# Patient Record
Sex: Female | Born: 1950 | Race: White | Hispanic: No | Smoking: Former smoker
Health system: Southern US, Community
[De-identification: ages and names within clinical notes are randomized; demographics above are authoritative.]

## PROBLEM LIST (undated history)

## (undated) DIAGNOSIS — E785 Hyperlipidemia, unspecified: Secondary | ICD-10-CM

## (undated) DIAGNOSIS — J449 Chronic obstructive pulmonary disease, unspecified: Secondary | ICD-10-CM

## (undated) DIAGNOSIS — E119 Type 2 diabetes mellitus without complications: Secondary | ICD-10-CM

## (undated) DIAGNOSIS — I10 Essential (primary) hypertension: Secondary | ICD-10-CM

## (undated) DIAGNOSIS — L309 Dermatitis, unspecified: Secondary | ICD-10-CM

## (undated) HISTORY — PX: LUNG TRANSPLANT, DOUBLE: SHX704

---

## 2015-06-24 ENCOUNTER — Encounter (HOSPITAL_COMMUNITY): Payer: Self-pay

## 2015-06-24 ENCOUNTER — Encounter (HOSPITAL_COMMUNITY)
Admission: RE | Admit: 2015-06-24 | Discharge: 2015-06-24 | Disposition: A | Discharge status: Home / Self Care | Payer: Medicare (Managed Care) | Source: Ambulatory Visit | Attending: Pulmonary Disease | Admitting: Pulmonary Disease

## 2015-06-24 VITALS — BP 127/47 | HR 78 | Resp 18 | Ht 60.5 in | Wt 91.7 lb

## 2015-06-24 DIAGNOSIS — L309 Dermatitis, unspecified: Secondary | ICD-10-CM | POA: Insufficient documentation

## 2015-06-24 DIAGNOSIS — R0600 Dyspnea, unspecified: Secondary | ICD-10-CM

## 2015-06-24 DIAGNOSIS — Z942 Lung transplant status: Secondary | ICD-10-CM | POA: Insufficient documentation

## 2015-06-24 HISTORY — DX: Dermatitis, unspecified: L30.9

## 2015-06-24 HISTORY — DX: Hyperlipidemia, unspecified: E78.5

## 2015-06-24 HISTORY — DX: Essential (primary) hypertension: I10

## 2015-06-24 HISTORY — DX: Chronic obstructive pulmonary disease, unspecified: J44.9

## 2015-06-24 NOTE — Progress Notes (Signed)
Meagan Dawson 65 y.o. female Pulmonary Rehab Orientation Note 1330-1500 Patient arrived today in Cardiac and Pulmonary Rehab for orientation to Pulmonary Rehab. She was transported from [employer] via wheel chair by her son-in-law. She is status post bilateral lung transplant and no longer needs oxygen. Color good, skin warm and dry. Patient is oriented to time and place. Patient's medical history, psychosocial health, and medications reviewed. Psychosocial assessment reveals pt lives with their daughter and son-in-law temporarily. She has had an extended hospitalization from post transplant complications at the [facility]. She has opted to come to [place] to live with her daughter and attend pulmonary rehab and [facility]. Pt reports her stress level is low not that her health is improveing.  Pt does not exhibit signs of depression.PHQ2/9 score 0/NA. Pt shows good  coping skills with positive outlook . She was offered emotional support and reassurance. Physical assessment reveals heart rate is normal, S1S2 present.  Breath sounds clear to auscultation, no wheezes, rales, or rhonchi. Grip strength equal, strong. Distal pulses palpable. No edema noted. Patient reports she does take medications as prescribed. Patient states she follows a Regular diet. The patient reports no specific efforts to gain or lose weight. She has lost 11 pounds since her transplant but encouraged by her 1 pound weight gain since residing with her daughter. Patient's weight will be monitored closely and she will have an individualized consultation and follow up with a RD. Demonstration and practice of PLB using pulse oximeter. Patient able to return demonstration satisfactorily. Safety and hand hygiene in the exercise area reviewed with patient. Patient voices understanding of the information reviewed. Department expectations discussed with patient and achievable goals were set. The patient shows enthusiasm about  attending the program and we look forward to working with this nice lady. The patient is scheduled for a 6 min walk test on Tuesday 3/7 at 3:45 and to begin exercise on Thursday 3/9 in the 1:30 class.   45 minutes was spent on a variety of activities such as assessment of the patient, obtaining baseline data including height, weight, BMI, and grip strength, verifying medical history, allergies, and current medications, and teaching patient strategies for performing tasks with less respiratory effort with emphasis on pursed lip breathing.

## 2015-07-05 ENCOUNTER — Encounter (HOSPITAL_COMMUNITY)
Admission: RE | Admit: 2015-07-05 | Discharge: 2015-07-05 | Disposition: A | Discharge status: Home / Self Care | Payer: Medicare (Managed Care) | Source: Ambulatory Visit | Attending: Pulmonary Disease | Admitting: Pulmonary Disease

## 2015-07-05 DIAGNOSIS — Z942 Lung transplant status: Secondary | ICD-10-CM | POA: Insufficient documentation

## 2015-07-05 DIAGNOSIS — R0600 Dyspnea, unspecified: Secondary | ICD-10-CM | POA: Insufficient documentation

## 2015-07-05 NOTE — Progress Notes (Signed)
Meagan Dawson completed a Six-Minute Walk Test on 07/05/15 . Meagan Dawson walked 539 feet with one standing break.  The patient's lowest oxygen saturation was 98 %, highest heart rate was 85 bpm , and highest blood pressure was 110/58. The patient was on room air. Patient was hindered by leg fatigue during her walk test.

## 2015-07-07 ENCOUNTER — Encounter (HOSPITAL_COMMUNITY)
Admission: RE | Admit: 2015-07-07 | Discharge: 2015-07-07 | Disposition: A | Discharge status: Home / Self Care | Payer: Medicare (Managed Care) | Source: Ambulatory Visit | Attending: Pulmonary Disease | Admitting: Pulmonary Disease

## 2015-07-07 DIAGNOSIS — R0600 Dyspnea, unspecified: Secondary | ICD-10-CM | POA: Diagnosis not present

## 2015-07-07 NOTE — Progress Notes (Signed)
Daily Session Note  Patient Details  Name: Meagan Dawson MRN: [id number] Date of Birth: December 17, 1950 Referring Provider:  No ref. provider found  Encounter Date: 07/07/2015  Check In:     Session Check In - 07/07/15 1345    Check-In   Location [facility]   Staff Present Rosebud Poles, RN, Luisa Hart, RN, BSN;Ramon Dredge, RN, MHA;Dorna Bloom, MS, ACSM RCEP, Exercise Physiologist;Jessica [place], MA, ACSM RCEP, Exercise Physiologist   Supervising physician immediately available to respond to emergencies [facility] immediately available   Physician(s) Elgergawy   Medication changes reported     No   Fall or balance concerns reported    No   Warm-up and Cool-down Performed as group-led instruction   Resistance Training Performed Yes   VAD Patient? No   Pain Assessment   Currently in Pain? No/denies   Multiple Pain Sites No      Capillary Blood Glucose: No results found for this or any previous visit (from the past 24 hour(s)).      Exercise Prescription Changes - 07/07/15 1500    Exercise Review   Progression No   Response to Exercise   Blood Pressure (Admit) 92/84 mmHg   Blood Pressure (Exercise) 86/60 mmHg  increased to 92/56 with hydration   Blood Pressure (Exit) 114/70 mmHg   Heart Rate (Admit) 84 bpm   Heart Rate (Exercise) 87 bpm   Heart Rate (Exit) 83 bpm   Oxygen Saturation (Admit) 100 %   Oxygen Saturation (Exercise) 96 %   Oxygen Saturation (Exit) 98 %   Rating of Perceived Exertion (Exercise) 17   Perceived Dyspnea (Exercise) 0   Symptoms back/leg pain   Duration Progress to 45 minutes of aerobic exercise without signs/symptoms of physical distress   Intensity THRR unchanged  40-80% HRR   Progression   Progression Continue to progress workloads to maintain intensity without signs/symptoms of physical distress.   Resistance Training   Training Prescription Yes   Weight orange bands   Reps 10-12   Recumbant Bike   Level  1   Minutes 15   NuStep   Level 1   Minutes 15   METs 1.5     Goals Met:  Exercise tolerated well Queuing for purse lip breathing No report of cardiac concerns or symptoms  Goals Unmet:  BP, hypotensive post exercise. Responsive to H2O.  Comments: Service time is from 1330 to 1530, see paper ITP for education.   Dr. Rush Farmer is Medical Director for Pulmonary Rehab at [facility].

## 2015-07-12 ENCOUNTER — Encounter (HOSPITAL_COMMUNITY)
Admission: RE | Admit: 2015-07-12 | Discharge: 2015-07-12 | Disposition: A | Discharge status: Home / Self Care | Payer: Medicare (Managed Care) | Source: Ambulatory Visit | Attending: Pulmonary Disease | Admitting: Pulmonary Disease

## 2015-07-12 DIAGNOSIS — R0600 Dyspnea, unspecified: Secondary | ICD-10-CM | POA: Diagnosis not present

## 2015-07-12 NOTE — Progress Notes (Signed)
Daily Session Note  Patient Details  Name: Meagan Dawson MRN: [id number] Date of Birth: [birth year] Referring Provider:  No ref. provider found  Encounter Date: 07/12/2015  Check In:     Session Check In - 07/12/15 1544    Check-In   Location [facility]   Staff Present Rosebud Poles, RN, BSN;Lisa Ysidro Evert, RN;Olinty Celesta Aver, MS, ACSM CEP, Exercise Physiologist   Supervising physician immediately available to respond to emergencies [facility] immediately available   Physician(s) Dr. Marily Memos   Medication changes reported     No   Fall or balance concerns reported    No   Warm-up and Cool-down Performed as group-led instruction   Resistance Training Performed Yes   VAD Patient? No   Pain Assessment   Currently in Pain? No/denies   Multiple Pain Sites No      Capillary Blood Glucose: No results found for this or any previous visit (from the past 24 hour(s)).      Exercise Prescription Changes - 07/12/15 1500    Exercise Review   Progression Yes   Response to Exercise   Blood Pressure (Admit) 90/40 mmHg   Blood Pressure (Exercise) 110/64 mmHg   Blood Pressure (Exit) 110/62 mmHg   Heart Rate (Admit) 78 bpm   Heart Rate (Exercise) 82 bpm   Heart Rate (Exit) 82 bpm   Oxygen Saturation (Admit) 97 %   Oxygen Saturation (Exercise) 97 %   Oxygen Saturation (Exit) 100 %   Rating of Perceived Exertion (Exercise) 17   Perceived Dyspnea (Exercise) 3   Symptoms back/leg pain   Duration Progress to 45 minutes of aerobic exercise without signs/symptoms of physical distress   Intensity THRR unchanged   Progression   Progression Continue to progress workloads to maintain intensity without signs/symptoms of physical distress.   Resistance Training   Training Prescription Yes   Weight orange bands   Reps 10-12   NuStep   Level 3   Minutes 15   METs 1.3   Arm Ergometer   Level 1   Minutes 15   Arm/Foot Ergometer   Level --   Minutes --   Track   Laps 5    Minutes 15     Goals Met:  Very weak and deconditioned, but tolerated exercises well.  Strength training done.  Goals Unmet:  Not Applicable  Comments: Service time is from 133 to 1530    Dr. Rush Farmer is Medical Director for Pulmonary Rehab at [facility].

## 2015-07-14 ENCOUNTER — Encounter (HOSPITAL_COMMUNITY)
Admission: RE | Admit: 2015-07-14 | Discharge: 2015-07-14 | Disposition: A | Discharge status: Home / Self Care | Payer: Medicare (Managed Care) | Source: Ambulatory Visit | Attending: Pulmonary Disease | Admitting: Pulmonary Disease

## 2015-07-14 DIAGNOSIS — R0600 Dyspnea, unspecified: Secondary | ICD-10-CM | POA: Diagnosis not present

## 2015-07-14 NOTE — Progress Notes (Signed)
Daily Session Note  Patient Details  Name: Meagan Dawson MRN: [id number] Date of Birth: 1950-07-07 Referring Provider:  No ref. provider found  Encounter Date: 07/14/2015  Check In:     Session Check In - 07/14/15 1352    Check-In   [facility]   Staff Present Rosebud Poles, RN, Luisa Hart, RN, BSN;Amber Fair, MS, ACSM RCEP, Exercise Physiologist;Annedrea Rosezella [place], RN, Izora Ribas, M.Ed., RD, LDN, CDE, Clinical Nutritionist II   Supervising physician immediately available to respond to emergencies [facility] immediately available   Physician(s) Dr. Jerilee Hoh   Medication changes reported     No   Fall or balance concerns reported    No   Warm-up and Cool-down Performed as group-led instruction   Resistance Training Performed Yes   VAD Patient? No   Pain Assessment   Currently in Pain? No/denies   Multiple Pain Sites No      Capillary Blood Glucose: No results found for this or any previous visit (from the past 24 hour(s)).      Exercise Prescription Changes - 07/14/15 1500    Exercise Review   Progression No   Response to Exercise   Blood Pressure (Admit) 104/60 mmHg   Blood Pressure (Exercise) 96/60 mmHg   Blood Pressure (Exit) 98/60 mmHg   Heart Rate (Admit) 80 bpm   Heart Rate (Exercise) 92 bpm   Heart Rate (Exit) 77 bpm   Oxygen Saturation (Admit) 100 %   Oxygen Saturation (Exercise) 100 %   Oxygen Saturation (Exit) 98 %   Rating of Perceived Exertion (Exercise) 19   Perceived Dyspnea (Exercise) 1   Duration Progress to 45 minutes of aerobic exercise without signs/symptoms of physical distress   Intensity THRR unchanged   Progression   Progression Continue to progress workloads to maintain intensity without signs/symptoms of physical distress.   Resistance Training   Training Prescription Yes   Weight orange bands   Reps 10-12   Interval Training   Interval Training No   Recumbant Bike   Level 1   Minutes 15   tolerated the recumbent bike better today with frequent rest   Track   Laps 5   Minutes 15     Goals Met:  Proper associated with RPD/PD & O2 Sat Exercise tolerated well Strength training completed today  Goals Unmet:  Not Applicable  Comments: Service time is from 1330 to 1525  See paper chart for education topic patient attended today.  Dr. Rush Farmer is Medical Director for Pulmonary Rehab at [facility].

## 2015-07-19 ENCOUNTER — Encounter (HOSPITAL_COMMUNITY)
Admission: RE | Admit: 2015-07-19 | Discharge: 2015-07-19 | Disposition: A | Discharge status: Home / Self Care | Payer: Medicare (Managed Care) | Source: Ambulatory Visit | Attending: Pulmonary Disease | Admitting: Pulmonary Disease

## 2015-07-19 DIAGNOSIS — R0600 Dyspnea, unspecified: Secondary | ICD-10-CM | POA: Diagnosis not present

## 2015-07-19 NOTE — Progress Notes (Signed)
Meagan Dawson 65 y.o. female Nutrition Note Spoke with pt. Pt is underweight. Per discussion, pt's UBW is around 95 lb. Pt wants to gain wt to a goal of 95-105 lb. Pt eats 3 meals and several snacks daily. Pt is living with her daughter, who does much of the cooking/grocery shopping. Pt's Rate Your Plate results not reviewed with pt due to pt's need for wt gain. Pt states her feeding tube was remove "just a few weeks ago."  Pt educated re: high calorie, high protein diet. Pt is on prednisone. Pt reports taking a calcium plus vitamin D supplement before her surgery and is not currently taking a calcium supplement. Pt c/o diarrhea/loose stools since Nissen procedure. Dietary management of diarrhea discussed. Pt expressed understanding of the information reviewed via feedback method.   No results found for: HGBA1C Nutrition Diagnosis ? Food-and nutrition-related knowledge deficit related to lack of exposure to information as related to diagnosis of pulmonary disease ? Increased energy expenditure related to increased energy requirements pre- and post-lung transplant as evidenced by BMI <20 and h/o wt loss and tube feeding Nutrition Rx/Est. Daily Nutrition Needs for: ? wt gain 1800-2300 Kcal  60-80 gm protein   1500 mg or less sodium Nutrition Intervention ? Pt's individual nutrition plan and goals reviewed with pt. ? Benefits of adopting healthy eating habits discussed when pt's Rate Your Plate reviewed. ? Pt to attend the Nutrition and Lung Disease class ? Handout given for: High Calorie, High Protein diet, recipes, and suggestions for increasing kcal and protein. ? Continual client-centered nutrition education by RD, as part of interdisciplinary care. Goal(s) 1. The pt will consume high-energy, high-nutrient dense beverages when necessary to compensate for decreased oral intake of solid foods. 2. Identify food quantities necessary to achieve wt gain of  -2# per week to a goal wt of 95-105 lb at  graduation from pulmonary rehab. Monitor and Evaluate progress toward nutrition goal with team.   Derek Mound, M.Ed, RD, LDN, CDE 07/19/2015 2:37 PM

## 2015-07-19 NOTE — Progress Notes (Signed)
Daily Session Note  Patient Details  Name: Meagan Dawson MRN: [id number] Date of Birth: May 30, 1950 Referring Provider:  No ref. provider found  Encounter Date: 07/19/2015  Check In:     Session Check In - 07/19/15 1506    Check-In   Location [facility]   Staff Present Rosebud Poles, RN, Deland Pretty, MS, ACSM CEP, Exercise Physiologist   Supervising physician immediately available to respond to emergencies [facility] immediately available   Physician(s) Dr. Marily Memos   Medication changes reported     No   Fall or balance concerns reported    No   Warm-up and Cool-down Performed as group-led instruction   Resistance Training Performed Yes   VAD Patient? No   Pain Assessment   Currently in Pain? No/denies   Multiple Pain Sites No      Capillary Blood Glucose: No results found for this or any previous visit (from the past 24 hour(s)).      Exercise Prescription Changes - 07/19/15 1500    Exercise Review   Progression No   Response to Exercise   Blood Pressure (Admit) 96/60 mmHg   Blood Pressure (Exercise) 94/50 mmHg   Blood Pressure (Exit) 92/60 mmHg   Heart Rate (Admit) 76 bpm   Heart Rate (Exercise) 88 bpm   Heart Rate (Exit) 76 bpm   Oxygen Saturation (Admit) 98 %   Oxygen Saturation (Exercise) 8898 %   Oxygen Saturation (Exit) 100 %   Rating of Perceived Exertion (Exercise) 13   Perceived Dyspnea (Exercise) 1   Duration Progress to 45 minutes of aerobic exercise without signs/symptoms of physical distress   Intensity THRR unchanged   Progression   Progression Continue to progress workloads to maintain intensity without signs/symptoms of physical distress.   Resistance Training   Training Prescription Yes   Weight orange bands   Reps 10-12   Interval Training   Interval Training No   Recumbant Bike   Level 1   Minutes 4  nutrition consult interferred with her exercising for the en   NuStep   Level 3   Minutes 15   METs 1.7   Track   Laps --  nutrition consult during this station     Goals Met:  Improved SOB with ADL's Exercise tolerated well Strength training completed today  Goals Unmet:  Not Applicable  Comments: Service time is from 1330 to 1500    Dr. Rush Farmer is Medical Director for Pulmonary Rehab at [facility].

## 2015-07-21 ENCOUNTER — Encounter (HOSPITAL_COMMUNITY)
Admission: RE | Admit: 2015-07-21 | Discharge: 2015-07-21 | Disposition: A | Discharge status: Home / Self Care | Payer: Medicare (Managed Care) | Source: Ambulatory Visit | Attending: Pulmonary Disease | Admitting: Pulmonary Disease

## 2015-07-21 DIAGNOSIS — R0600 Dyspnea, unspecified: Secondary | ICD-10-CM | POA: Diagnosis not present

## 2015-07-21 NOTE — Progress Notes (Signed)
I have reviewed a Home Exercise Prescription with Meagan Dawson. Meagan Dawson is currently walking on treadmill for 5 minutes at home.  The patient was advised to walk 5 days a week for 5-10 minutes.  Meagan Dawson and I discussed how to progress their exercise prescription.  The patient stated that their goals were to [place] without leg pain, and be able to walk on treadmill for 10 minutes. The patient stated that they understand the exercise prescription.  We reviewed exercise guidelines, target heart rate during exercise, oxygen use, weather, home pulse oximeter, endpoints for exercise, and goals.  Patient is encouraged to come to me with any questions. I will continue to follow up with the patient to assist them with progression and safety.     Meagan Dawson [employer]

## 2015-07-21 NOTE — Progress Notes (Signed)
Daily Session Note  Patient Details  Name: Tylene Kinnan MRN: [id number] Date of Birth: 02/08/1951 Referring Provider:  No ref. provider found  Encounter Date: 07/21/2015  Check In:     Session Check In - 07/21/15 1336    Check-In   Location [facility]   Staff Present Joan Behrens, RN, BSN;Lisa Hughes, RN;Amber Fair, MS, ACSM RCEP, Exercise Physiologist   Supervising physician immediately available to respond to emergencies [facility] immediately available   Physician(s) Dr Elgergawy   Medication changes reported     No   Fall or balance concerns reported    No   Warm-up and Cool-down Performed as group-led instruction   Resistance Training Performed Yes   VAD Patient? No   Pain Assessment   Currently in Pain? No/denies   Multiple Pain Sites No      Capillary Blood Glucose: No results found for this or any previous visit (from the past 24 hour(s)).      Exercise Prescription Changes - 07/21/15 1500    Exercise Review   Progression No   Response to Exercise   Blood Pressure (Admit) 102/58 mmHg   Blood Pressure (Exercise) 90/60 mmHg   Blood Pressure (Exit) 94/61 mmHg   Heart Rate (Admit) 84 bpm   Heart Rate (Exercise) 91 bpm   Heart Rate (Exit) 81 bpm   Oxygen Saturation (Admit) 100 %   Oxygen Saturation (Exercise) 98 %   Oxygen Saturation (Exit) 100 %   Rating of Perceived Exertion (Exercise) 13   Perceived Dyspnea (Exercise) 1   Duration Progress to 45 minutes of aerobic exercise without signs/symptoms of physical distress   Intensity THRR unchanged   Progression   Progression Continue to progress workloads to maintain intensity without signs/symptoms of physical distress.   Resistance Training   Training Prescription Yes   Weight orange bands   Reps 10-12   Interval Training   Interval Training No   NuStep   Level 3   Minutes 15   METs 1.7   Track   Laps 6   Minutes 15     Goals Met:  Improved SOB with ADL's Exercise  tolerated well Strength training completed today  Goals Unmet:  Not Applicable  Comments: Service time is from 1330 to 1500    Dr. Wesam G. Yacoub is Medical Director for Pulmonary Rehab at [facility]. 

## 2015-07-26 ENCOUNTER — Encounter (HOSPITAL_COMMUNITY)
Admission: RE | Admit: 2015-07-26 | Discharge: 2015-07-26 | Disposition: A | Discharge status: Home / Self Care | Payer: Medicare (Managed Care) | Source: Ambulatory Visit | Attending: Pulmonary Disease | Admitting: Pulmonary Disease

## 2015-07-26 DIAGNOSIS — R0600 Dyspnea, unspecified: Secondary | ICD-10-CM | POA: Diagnosis not present

## 2015-07-26 NOTE — Progress Notes (Signed)
Daily Session Note  Patient Details  Name: Meagan Dawson MRN: [id number] Date of Birth: 09-28-1950 Referring Provider:  No ref. provider found  Encounter Date: 07/26/2015  Check In:     Session Check In - 07/26/15 1526    Check-In   Location [facility]   Staff Present Rosebud Poles, RN, BSN;Makaiah Terwilliger Ysidro Evert, RN;Olinty Celesta Aver, MS, ACSM CEP, Exercise Physiologist   Supervising physician immediately available to respond to emergencies [facility] immediately available   Physician(s) Dr. Marily Memos   Medication changes reported     No   Fall or balance concerns reported    No   Warm-up and Cool-down Performed as group-led instruction   Resistance Training Performed Yes   VAD Patient? No   Pain Assessment   Currently in Pain? No/denies   Multiple Pain Sites No      Capillary Blood Glucose: No results found for this or any previous visit (from the past 24 hour(s)).      Exercise Prescription Changes - 07/26/15 1500    Exercise Review   Progression Yes   Response to Exercise   Blood Pressure (Admit) 96/60 mmHg   Blood Pressure (Exercise) 92/50 mmHg   Blood Pressure (Exit) 96/54 mmHg   Heart Rate (Admit) 87 bpm   Heart Rate (Exercise) 97 bpm   Heart Rate (Exit) 73 bpm   Oxygen Saturation (Admit) 100 %   Oxygen Saturation (Exercise) 98 %   Oxygen Saturation (Exit) 99 %   Rating of Perceived Exertion (Exercise) 19   Perceived Dyspnea (Exercise) 1   Duration Progress to 45 minutes of aerobic exercise without signs/symptoms of physical distress   Intensity THRR unchanged   Progression   Progression Continue to progress workloads to maintain intensity without signs/symptoms of physical distress.   Resistance Training   Training Prescription Yes   Weight orange bands   Reps 10-12   Interval Training   Interval Training No   Recumbant Bike   Level 1   Minutes 15   NuStep   Level 4   Minutes 15   METs 1.6   Track   Laps 7   Minutes 15     Goals  Met:  Exercise tolerated well No report of cardiac concerns or symptoms Strength training completed today  Goals Unmet:  Not Applicable  Comments: Service time is from 1330 to 1515    Dr. Rush Farmer is Medical Director for Pulmonary Rehab at [facility].

## 2015-07-28 ENCOUNTER — Encounter (HOSPITAL_COMMUNITY)
Admission: RE | Admit: 2015-07-28 | Discharge: 2015-07-28 | Disposition: A | Discharge status: Home / Self Care | Payer: Medicare (Managed Care) | Source: Ambulatory Visit | Attending: Pulmonary Disease | Admitting: Pulmonary Disease

## 2015-07-28 VITALS — Wt 91.3 lb

## 2015-07-28 DIAGNOSIS — R0600 Dyspnea, unspecified: Secondary | ICD-10-CM | POA: Diagnosis not present

## 2015-07-28 NOTE — Progress Notes (Signed)
Daily Session Note  Patient Details  Name: Meagan Dawson MRN: [id number] Date of Birth: 27-Dec-1950 Referring Provider:  No ref. provider found  Encounter Date: 07/28/2015  Check In:     Session Check In - 07/28/15 1359    Check-In   [facility]   Staff Present Rosebud Poles, RN, BSN;Molly diVincenzo, MS, ACSM RCEP, Exercise Physiologist   Supervising physician immediately available to respond to emergencies [facility] immediately available   Physician(s) Dr. Marily Memos   Medication changes reported     No   Fall or balance concerns reported    No   Warm-up and Cool-down Performed as group-led instruction   Resistance Training Performed Yes   VAD Patient? No   Pain Assessment   Currently in Pain? No/denies   Multiple Pain Sites No      Capillary Blood Glucose: No results found for this or any previous visit (from the past 24 hour(s)).      Exercise Prescription Changes - 07/28/15 1600    Exercise Review   Progression Yes   Response to Exercise   Blood Pressure (Admit) 102/56 mmHg   Blood Pressure (Exercise) 98/52 mmHg   Blood Pressure (Exit) 94/50 mmHg   Heart Rate (Admit) 85 bpm   Heart Rate (Exercise) 85 bpm   Heart Rate (Exit) 82 bpm   Oxygen Saturation (Admit) 98 %   Oxygen Saturation (Exercise) 99 %   Oxygen Saturation (Exit) 98 %   Rating of Perceived Exertion (Exercise) 17   Perceived Dyspnea (Exercise) 0   Duration Progress to 45 minutes of aerobic exercise without signs/symptoms of physical distress   Intensity THRR unchanged   Progression   Progression Continue to progress workloads to maintain intensity without signs/symptoms of physical distress.   Resistance Training   Training Prescription Yes   Weight orange bands   Reps 10-12   Interval Training   Interval Training No   Recumbant Bike   Level 1   Minutes 15   NuStep   Level 4   Minutes 15   METs 1.7     Goals Met:  Proper associated with RPD/PD & O2  Sat Improved SOB with ADL's Exercise tolerated well Queuing for purse lip breathing Strength training completed today  Goals Unmet:  Not Applicable  Comments: Service time is from 1330 to 1530  Attended the pulmonary medications class today.  Dr. Rush Farmer is Medical Director for Pulmonary Rehab at [facility].

## 2015-08-02 ENCOUNTER — Encounter (HOSPITAL_COMMUNITY)
Admission: RE | Admit: 2015-08-02 | Discharge: 2015-08-02 | Disposition: A | Discharge status: Home / Self Care | Payer: Medicare (Managed Care) | Source: Ambulatory Visit | Attending: Pulmonary Disease | Admitting: Pulmonary Disease

## 2015-08-02 VITALS — Wt 88.2 lb

## 2015-08-02 DIAGNOSIS — Z942 Lung transplant status: Secondary | ICD-10-CM | POA: Diagnosis present

## 2015-08-02 DIAGNOSIS — R0600 Dyspnea, unspecified: Secondary | ICD-10-CM | POA: Diagnosis not present

## 2015-08-02 NOTE — Progress Notes (Signed)
Daily Session Note  Patient Details  Name: Meagan Dawson MRN: [id number] Date of Birth: 01-Feb-1951 Referring Provider:  No ref. provider found  Encounter Date: 08/02/2015  Check In:     Session Check In - 08/02/15 1534    Check-In   Location [facility]   Staff Present Rodney Langton, RN;Olinty Celesta Aver, MS, ACSM CEP, Exercise Physiologist;Portia Rollene Rotunda, RN, BSN   Supervising physician immediately available to respond to emergencies [facility] immediately available   Physician(s) Dr. Marily Memos   Medication changes reported     Yes  Pt taken off her BP medication.   Fall or balance concerns reported    No   Warm-up and Cool-down Performed as group-led instruction   Resistance Training Performed Yes   VAD Patient? No   Pain Assessment   Currently in Pain? No/denies   Multiple Pain Sites No      Capillary Blood Glucose: No results found for this or any previous visit (from the past 24 hour(s)).      Exercise Prescription Changes - 08/02/15 1500    Exercise Review   Progression Yes   Response to Exercise   Blood Pressure (Admit) 112/60 mmHg   Blood Pressure (Exercise) 102/58 mmHg   Blood Pressure (Exit) 96/60 mmHg   Heart Rate (Admit) 78 bpm   Heart Rate (Exercise) 86 bpm   Heart Rate (Exit) 75 bpm   Oxygen Saturation (Admit) 97 %   Oxygen Saturation (Exercise) 98 %   Oxygen Saturation (Exit) 100 %   Rating of Perceived Exertion (Exercise) 19   Perceived Dyspnea (Exercise) 1   Duration Progress to 45 minutes of aerobic exercise without signs/symptoms of physical distress   Intensity THRR unchanged   Progression   Progression Continue to progress workloads to maintain intensity without signs/symptoms of physical distress.   Resistance Training   Training Prescription Yes   Weight orange bands   Reps 10-12   Interval Training   Interval Training No   Recumbant Bike   Level 1   Minutes 15   NuStep   Level 4   Minutes 15   METs 1.6   Track   Laps 9   Minutes 15     Goals Met:  Achieving weight loss Exercise tolerated well No report of cardiac concerns or symptoms Strength training completed today  Goals Unmet:  Not Applicable  Comments: Service time is from 1330 to 1515    Dr. Rush Farmer is Medical Director for Pulmonary Rehab at [facility].

## 2015-08-04 ENCOUNTER — Encounter (HOSPITAL_COMMUNITY)
Admission: RE | Admit: 2015-08-04 | Discharge: 2015-08-04 | Disposition: A | Discharge status: Home / Self Care | Payer: Medicare (Managed Care) | Source: Ambulatory Visit | Attending: Pulmonary Disease | Admitting: Pulmonary Disease

## 2015-08-04 ENCOUNTER — Encounter (HOSPITAL_COMMUNITY): Payer: Medicare (Managed Care)

## 2015-08-04 VITALS — Wt 89.3 lb

## 2015-08-04 DIAGNOSIS — Z942 Lung transplant status: Secondary | ICD-10-CM

## 2015-08-04 DIAGNOSIS — R0600 Dyspnea, unspecified: Secondary | ICD-10-CM | POA: Diagnosis not present

## 2015-08-04 NOTE — Progress Notes (Signed)
Daily Session Note  Patient Details  Name: Meagan Dawson MRN: [id number] Date of Birth: [birth year] Referring Provider:  Rush Farmer, MD  Encounter Date: 08/04/2015  Check In:     Session Check In - 08/04/15 1419    Check-In   [facility]   Staff Present Rosebud Poles, RN, BSN;Molly diVincenzo, MS, ACSM RCEP, Exercise Physiologist;Lisa Ysidro Evert, RN   Supervising physician immediately available to respond to emergencies [facility] immediately available   Physician(s) Dr. Loleta Books   Medication changes reported     No   Fall or balance concerns reported    No   Warm-up and Cool-down Performed as group-led instruction   Resistance Training Performed Yes   VAD Patient? No   Pain Assessment   Currently in Pain? No/denies   Multiple Pain Sites No      Capillary Blood Glucose: No results found for this or any previous visit (from the past 24 hour(s)).      Exercise Prescription Changes - 08/04/15 1600    Exercise Review   Progression No   Response to Exercise   Blood Pressure (Admit) 118/64 mmHg   Blood Pressure (Exercise) 116/60 mmHg   Blood Pressure (Exit) 100/58 mmHg   Heart Rate (Admit) 78 bpm   Heart Rate (Exercise) 86 bpm   Heart Rate (Exit) 76 bpm   Oxygen Saturation (Admit) 97 %   Oxygen Saturation (Exercise) 99 %   Oxygen Saturation (Exit) 100 %   Rating of Perceived Exertion (Exercise) 19   Perceived Dyspnea (Exercise) 0   Duration Progress to 45 minutes of aerobic exercise without signs/symptoms of physical distress   Intensity THRR unchanged   Progression   Progression Continue to progress workloads to maintain intensity without signs/symptoms of physical distress.   Resistance Training   Training Prescription Yes   Weight orange bands   Reps 10-12   Interval Training   Interval Training No   Recumbant Bike   Level 1   Minutes 15   NuStep   Level 4   Minutes 15   METs 1.7     Goals Met:  Improved SOB with  ADL's Exercise tolerated well Strength training completed today  Goals Unmet:  Not Applicable  Comments: Service time is from 1330 to 1530    Dr. Rush Farmer is Medical Director for Pulmonary Rehab at [facility].

## 2015-08-09 ENCOUNTER — Encounter (HOSPITAL_COMMUNITY)
Admission: RE | Admit: 2015-08-09 | Discharge: 2015-08-09 | Disposition: A | Discharge status: Home / Self Care | Payer: Medicare (Managed Care) | Source: Ambulatory Visit | Attending: Pulmonary Disease | Admitting: Pulmonary Disease

## 2015-08-09 VITALS — Wt 88.2 lb

## 2015-08-09 DIAGNOSIS — R0600 Dyspnea, unspecified: Secondary | ICD-10-CM | POA: Diagnosis not present

## 2015-08-09 DIAGNOSIS — Z942 Lung transplant status: Secondary | ICD-10-CM

## 2015-08-09 NOTE — Progress Notes (Signed)
Daily Session Note  Patient Details  Name: Meagan Dawson MRN: [id number] Date of Birth: 1950/10/29 Referring Provider:  No ref. provider found  Encounter Date: 08/09/2015  Check In:   Capillary Blood Glucose: No results found for this or any previous visit (from the past 24 hour(s)).      Exercise Prescription Changes - 08/09/15 1530    Exercise Review   Progression No   Response to Exercise   Blood Pressure (Admit) 106/56 mmHg   Blood Pressure (Exercise) 120/56 mmHg   Blood Pressure (Exit) 106/56 mmHg   Heart Rate (Admit) 91 bpm   Heart Rate (Exercise) 88 bpm   Heart Rate (Exit) 80 bpm   Oxygen Saturation (Admit) 100 %   Oxygen Saturation (Exercise) 98 %   Oxygen Saturation (Exit) 99 %   Rating of Perceived Exertion (Exercise) 18   Perceived Dyspnea (Exercise) 1   Duration Progress to 45 minutes of aerobic exercise without signs/symptoms of physical distress   Intensity THRR unchanged   Progression   Progression Continue to progress workloads to maintain intensity without signs/symptoms of physical distress.   Resistance Training   Training Prescription Yes   Weight orange bands   Reps 10-12   Interval Training   Interval Training No   Recumbant Bike   Level 1   Minutes 15   NuStep   Level 4   Minutes 15   METs 2   Track   Laps 9   Minutes 15     Goals Met:  Improved SOB with ADL's Using PLB without cueing & demonstrates good technique Exercise tolerated well Queuing for purse lip breathing No report of cardiac concerns or symptoms Strength training completed today  Goals Unmet:  Not Applicable  Comments: Service time is from 1330 to 1510    Dr. Rush Farmer is Medical Director for Pulmonary Rehab at [facility].

## 2015-08-11 ENCOUNTER — Encounter (HOSPITAL_COMMUNITY)
Admission: RE | Admit: 2015-08-11 | Discharge: 2015-08-11 | Disposition: A | Discharge status: Home / Self Care | Payer: Medicare (Managed Care) | Source: Ambulatory Visit | Attending: Pulmonary Disease | Admitting: Pulmonary Disease

## 2015-08-11 VITALS — Wt 88.8 lb

## 2015-08-11 DIAGNOSIS — Z942 Lung transplant status: Secondary | ICD-10-CM

## 2015-08-11 DIAGNOSIS — R0600 Dyspnea, unspecified: Secondary | ICD-10-CM

## 2015-08-11 NOTE — Progress Notes (Signed)
Daily Session Note  Patient Details  Name: Meagan Dawson MRN: [id number] Date of Birth: 02-25-1951 Referring Provider:    Encounter Date: 08/11/2015  Check In:     Session Check In - 08/11/15 1352    Check-In   Staff Present Su Hilt, MS, ACSM RCEP, Exercise Physiologist;Gerrick Ray Rollene Rotunda, RN, Roque Cash, RN   Supervising physician immediately available to respond to emergencies [facility] immediately available   Physician(s) Dr. Aggie Moats   Medication changes reported     No   Fall or balance concerns reported    No   Warm-up and Cool-down Performed as group-led instruction   Resistance Training Performed Yes   Pain Assessment   Currently in Pain? No/denies   Multiple Pain Sites No      Capillary Blood Glucose: No results found for this or any previous visit (from the past 24 hour(s)).      Exercise Prescription Changes - 08/11/15 1555    Exercise Review   Progression No   Response to Exercise   Blood Pressure (Admit) 116/60 mmHg   Blood Pressure (Exercise) 114/68 mmHg   Blood Pressure (Exit) 112/62 mmHg   Heart Rate (Admit) 74 bpm   Heart Rate (Exercise) 82 bpm   Heart Rate (Exit) 70 bpm   Oxygen Saturation (Admit) 100 %   Oxygen Saturation (Exercise) 100 %   Oxygen Saturation (Exit) 99 %   Rating of Perceived Exertion (Exercise) 13   Perceived Dyspnea (Exercise) 1   Duration Progress to 45 minutes of aerobic exercise without signs/symptoms of physical distress   Intensity THRR unchanged   Progression   Progression Continue to progress workloads to maintain intensity without signs/symptoms of physical distress.   Resistance Training   Training Prescription Yes   Weight orange bands   Reps 10-12   Interval Training   Interval Training No   Recumbant Bike   Level --   Minutes --   NuStep   Level --   Minutes --   METs --   Rower   Level 1   Minutes 15   Track   Laps 10   Minutes 15     Goals Met:  Improved SOB with ADL's Using PLB  without cueing & demonstrates good technique Exercise tolerated well No report of cardiac concerns or symptoms Strength training completed today  Goals Unmet:  Not Applicable  Comments: Service time is from 1330 to 1530, see paper ITP for education documentation.   Dr. Rush Farmer is Medical Director for Pulmonary Rehab at [facility].

## 2015-08-16 ENCOUNTER — Encounter (HOSPITAL_COMMUNITY)
Admission: RE | Admit: 2015-08-16 | Discharge: 2015-08-16 | Disposition: A | Discharge status: Home / Self Care | Payer: Medicare (Managed Care) | Source: Ambulatory Visit | Attending: Pulmonary Disease | Admitting: Pulmonary Disease

## 2015-08-16 VITALS — Wt 91.0 lb

## 2015-08-16 DIAGNOSIS — R0600 Dyspnea, unspecified: Secondary | ICD-10-CM | POA: Diagnosis not present

## 2015-08-16 DIAGNOSIS — Z942 Lung transplant status: Secondary | ICD-10-CM

## 2015-08-16 NOTE — Progress Notes (Signed)
Daily Session Note  Patient Details  Name: Meagan Dawson MRN: [id number] Date of Birth: 09-14-1950 Referring Provider:    Encounter Date: 08/16/2015  Check In:     Session Check In - 08/16/15 1425    Check-In   [facility]   Staff Present Rosebud Poles, RN, BSN;Delmi Fulfer, MS, ACSM RCEP, Exercise Physiologist;Lisa Ysidro Evert, RN   Supervising physician immediately available to respond to emergencies [facility] immediately available   Physician(s) Dr. Marily Memos   Medication changes reported     No   Fall or balance concerns reported    No   Warm-up and Cool-down Performed as group-led instruction   Resistance Training Performed Yes   VAD Patient? No   Pain Assessment   Currently in Pain? No/denies   Multiple Pain Sites No      Capillary Blood Glucose: No results found for this or any previous visit (from the past 24 hour(s)).      Exercise Prescription Changes - 08/16/15 1500    Exercise Review   Progression --   Response to Exercise   Blood Pressure (Admit) 120/60 mmHg   Blood Pressure (Exercise) 104/56 mmHg   Blood Pressure (Exit) 112/62 mmHg   Heart Rate (Admit) 68 bpm   Heart Rate (Exercise) 81 bpm   Heart Rate (Exit) 69 bpm   Oxygen Saturation (Admit) 99 %   Oxygen Saturation (Exercise) 97 %   Oxygen Saturation (Exit) 100 %   Rating of Perceived Exertion (Exercise) 13   Perceived Dyspnea (Exercise) 1   Duration Progress to 45 minutes of aerobic exercise without signs/symptoms of physical distress   Intensity THRR unchanged   Progression   Progression Continue to progress workloads to maintain intensity without signs/symptoms of physical distress.   Resistance Training   Training Prescription Yes   Weight orange bands   Reps 10-12   Interval Training   Interval Training No   NuStep   Level 4   Minutes 15   METs 1.5   Rower   Level 1   Minutes 15   Track   Laps 8   Minutes 15     Goals Met:  Exercise tolerated  well Queuing for purse lip breathing No report of cardiac concerns or symptoms Strength training completed today  Goals Unmet:  Not Applicable  Comments: Service time is from 1:30pm to 3:30pm    Dr. Rush Farmer is Medical Director for Pulmonary Rehab at [facility].

## 2015-08-18 ENCOUNTER — Encounter (HOSPITAL_COMMUNITY)
Admission: RE | Admit: 2015-08-18 | Discharge: 2015-08-18 | Disposition: A | Discharge status: Home / Self Care | Payer: Medicare (Managed Care) | Source: Ambulatory Visit | Attending: Pulmonary Disease | Admitting: Pulmonary Disease

## 2015-08-18 VITALS — Wt 89.3 lb

## 2015-08-18 DIAGNOSIS — R0600 Dyspnea, unspecified: Secondary | ICD-10-CM | POA: Diagnosis not present

## 2015-08-18 NOTE — Progress Notes (Signed)
Daily Session Note  Patient Details  Name: Meagan Dawson MRN: [id number] Date of Birth: Feb 16, 1951 Referring Provider:    Encounter Date: 08/18/2015  Check In:     Session Check In - 08/18/15 1349    Check-In   [facility]   Staff Present Rosebud Poles, RN, BSN;Molly diVincenzo, MS, ACSM RCEP, Exercise Physiologist;Tynan Boesel Rollene Rotunda, RN, BSN   Supervising physician immediately available to respond to emergencies [facility] immediately available   Physician(s) Dr. Marily Memos   Medication changes reported     No   Fall or balance concerns reported    No   Warm-up and Cool-down Performed as group-led instruction   Resistance Training Performed Yes   VAD Patient? No   Pain Assessment   Currently in Pain? No/denies   Multiple Pain Sites No      Capillary Blood Glucose: No results found for this or any previous visit (from the past 24 hour(s)).      Exercise Prescription Changes - 08/18/15 1623    Response to Exercise   Blood Pressure (Admit) 120/64 mmHg   Blood Pressure (Exercise) 106/70 mmHg   Blood Pressure (Exit) 122/70 mmHg   Heart Rate (Admit) 79 bpm   Heart Rate (Exercise) 78 bpm   Heart Rate (Exit) 70 bpm   Oxygen Saturation (Admit) 100 %   Oxygen Saturation (Exercise) 100 %   Oxygen Saturation (Exit) 100 %   Rating of Perceived Exertion (Exercise) 12   Perceived Dyspnea (Exercise) 0   Duration Progress to 45 minutes of aerobic exercise without signs/symptoms of physical distress   Intensity THRR unchanged   Progression   Progression Continue to progress workloads to maintain intensity without signs/symptoms of physical distress.   Resistance Training   Training Prescription Yes   Weight orange bands   Reps 10-12   Interval Training   Interval Training No   NuStep   Level 4   Minutes 15   METs 2.3   Rower   Level --   Minutes --   Track   Laps 8   Minutes 15     Goals Met:  Improved SOB with ADL's Using PLB without cueing &  demonstrates good technique Exercise tolerated well No report of cardiac concerns or symptoms Strength training completed today  Goals Unmet:  Not Applicable  Comments: Service time is from 1330 to 1515   Dr. Rush Farmer is Medical Director for Pulmonary Rehab at [facility].

## 2015-08-23 ENCOUNTER — Encounter (HOSPITAL_COMMUNITY)
Admission: RE | Admit: 2015-08-23 | Discharge: 2015-08-23 | Disposition: A | Discharge status: Home / Self Care | Payer: Medicare (Managed Care) | Source: Ambulatory Visit | Attending: Pulmonary Disease | Admitting: Pulmonary Disease

## 2015-08-23 VITALS — Wt 88.8 lb

## 2015-08-23 DIAGNOSIS — Z942 Lung transplant status: Secondary | ICD-10-CM

## 2015-08-23 DIAGNOSIS — R0600 Dyspnea, unspecified: Secondary | ICD-10-CM | POA: Diagnosis not present

## 2015-08-23 NOTE — Progress Notes (Signed)
Daily Session Note  Patient Details  Name: Meagan Dawson MRN: [id number] Date of Birth: Nov 25, 1950 Referring Provider:    Encounter Date: 08/23/2015  Check In:     Session Check In - 08/23/15 1527    Check-In   [facility]   Staff Present Rosebud Poles, RN, BSN;Molly diVincenzo, MS, ACSM RCEP, Exercise Physiologist;Portia Rollene Rotunda, RN, BSN   Supervising physician immediately available to respond to emergencies [facility] immediately available   Physician(s) Dr. Marily Memos   Medication changes reported     No   Fall or balance concerns reported    No   Warm-up and Cool-down Performed as group-led instruction   Resistance Training Performed Yes   VAD Patient? No   Pain Assessment   Currently in Pain? No/denies   Multiple Pain Sites No      Capillary Blood Glucose: No results found for this or any previous visit (from the past 24 hour(s)).      Exercise Prescription Changes - 08/23/15 1500    Response to Exercise   Blood Pressure (Admit) 110/60 mmHg   Blood Pressure (Exercise) 122/60 mmHg   Blood Pressure (Exit) 110/56 mmHg   Heart Rate (Admit) 75 bpm   Heart Rate (Exercise) 82 bpm   Heart Rate (Exit) 76 bpm   Oxygen Saturation (Admit) 99 %   Oxygen Saturation (Exercise) 97 %   Oxygen Saturation (Exit) 99 %   Rating of Perceived Exertion (Exercise) 12   Perceived Dyspnea (Exercise) 0   Duration Progress to 45 minutes of aerobic exercise without signs/symptoms of physical distress   Intensity THRR unchanged   Progression   Progression Continue to progress workloads to maintain intensity without signs/symptoms of physical distress.   Resistance Training   Training Prescription Yes   Weight orange bands   Reps 10-12   Interval Training   Interval Training No   Recumbant Bike   Level 1   Minutes 15   NuStep   Level 4   Minutes 15   METs 2.1   Track   Laps 8   Minutes 15     Goals Met:  Exercise tolerated well No report of cardiac  concerns or symptoms Strength training completed today  Goals Unmet:  Not Applicable  Comments: Service time is from 1330 to 1455    Dr. Rush Farmer is Medical Director for Pulmonary Rehab at [facility].

## 2015-08-25 ENCOUNTER — Encounter (HOSPITAL_COMMUNITY)
Admission: RE | Admit: 2015-08-25 | Discharge: 2015-08-25 | Disposition: A | Discharge status: Home / Self Care | Payer: Medicare (Managed Care) | Source: Ambulatory Visit | Attending: Pulmonary Disease | Admitting: Pulmonary Disease

## 2015-08-25 VITALS — Wt 88.4 lb

## 2015-08-25 DIAGNOSIS — Z942 Lung transplant status: Secondary | ICD-10-CM

## 2015-08-25 DIAGNOSIS — R0600 Dyspnea, unspecified: Secondary | ICD-10-CM

## 2015-08-25 NOTE — Progress Notes (Signed)
Daily Session Note  Patient Details  Name: Meagan Dawson MRN: [id number] Date of Birth: 1951/01/27 Referring Provider:    Encounter Date: 08/25/2015  Check In:     Session Check In - 08/25/15 1330    Check-In   [facility]   Staff Present Rosebud Poles, RN, BSN;Molly diVincenzo, MS, ACSM RCEP, Exercise Physiologist;Carmeline Kowal Ysidro Evert, RN   Supervising physician immediately available to respond to emergencies [facility] immediately available   Medication changes reported     No   Fall or balance concerns reported    No   Warm-up and Cool-down Performed as group-led instruction   Resistance Training Performed Yes   VAD Patient? No   Pain Assessment   Currently in Pain? No/denies   Multiple Pain Sites No      Capillary Blood Glucose: No results found for this or any previous visit (from the past 24 hour(s)).      Exercise Prescription Changes - 08/25/15 1500    Exercise Review   Progression Yes   Response to Exercise   Blood Pressure (Admit) 106/70 mmHg   Blood Pressure (Exercise) 126/60 mmHg   Blood Pressure (Exit) 100/50 mmHg   Heart Rate (Admit) 75 bpm   Heart Rate (Exercise) 79 bpm   Heart Rate (Exit) 72 bpm   Oxygen Saturation (Admit) 99 %   Oxygen Saturation (Exercise) 96 %   Oxygen Saturation (Exit) 99 %   Rating of Perceived Exertion (Exercise) 15   Perceived Dyspnea (Exercise) 1   Duration Progress to 45 minutes of aerobic exercise without signs/symptoms of physical distress   Intensity THRR unchanged   Progression   Progression Continue to progress workloads to maintain intensity without signs/symptoms of physical distress.   Resistance Training   Training Prescription Yes   Weight orange abnds   Reps 10-12   Interval Training   Interval Training No   NuStep   Level 5   Minutes 15   Rower   Level 1   Minutes 15   Track   Laps 7   Minutes 15     Goals Met:  Using PLB without cueing & demonstrates good  technique Exercise tolerated well No report of cardiac concerns or symptoms Strength training completed today  Goals Unmet:  Not Applicable  Comments: Service time is from 1330 to 1445   Dr. Rush Farmer is Medical Director for Pulmonary Rehab at [facility].

## 2015-08-30 ENCOUNTER — Encounter (HOSPITAL_COMMUNITY): Payer: Medicare (Managed Care)

## 2015-09-01 ENCOUNTER — Encounter (HOSPITAL_COMMUNITY): Payer: Medicare (Managed Care)

## 2015-09-06 ENCOUNTER — Encounter (HOSPITAL_COMMUNITY): Payer: Medicare (Managed Care)

## 2015-09-08 ENCOUNTER — Encounter (HOSPITAL_COMMUNITY)
Admission: RE | Admit: 2015-09-08 | Discharge: 2015-09-08 | Disposition: A | Discharge status: Home / Self Care | Payer: Medicare (Managed Care) | Source: Ambulatory Visit | Attending: Pulmonary Disease | Admitting: Pulmonary Disease

## 2015-09-08 VITALS — Wt 91.9 lb

## 2015-09-08 DIAGNOSIS — Z942 Lung transplant status: Secondary | ICD-10-CM | POA: Diagnosis present

## 2015-09-08 DIAGNOSIS — R0600 Dyspnea, unspecified: Secondary | ICD-10-CM | POA: Diagnosis not present

## 2015-09-08 NOTE — Progress Notes (Signed)
Daily Session Note  Patient Details  Name: Meagan Dawson MRN: [id number] Date of Birth: 1951/04/01 Referring Provider:    Encounter Date: 09/08/2015  Check In:     Session Check In - 09/08/15 1345    Check-In   [facility]   Staff Present Rosebud Poles, RN, Luisa Hart, RN, BSN;Molly diVincenzo, MS, ACSM RCEP, Exercise Physiologist   Supervising physician immediately available to respond to emergencies [facility] immediately available   Physician(s) Dr. Marily Memos   Medication changes reported     No   Fall or balance concerns reported    No   Warm-up and Cool-down Performed as group-led instruction   Resistance Training Performed Yes   VAD Patient? No   Pain Assessment   Currently in Pain? No/denies   Multiple Pain Sites No      Capillary Blood Glucose: No results found for this or any previous visit (from the past 24 hour(s)).      Exercise Prescription Changes - 09/08/15 1620    Response to Exercise   Blood Pressure (Admit) 124/74 mmHg   Blood Pressure (Exercise) 110/58 mmHg   Blood Pressure (Exit) 122/60 mmHg   Heart Rate (Admit) 87 bpm   Heart Rate (Exercise) 85 bpm   Heart Rate (Exit) 74 bpm   Oxygen Saturation (Admit) 94 %   Oxygen Saturation (Exercise) 98 %   Oxygen Saturation (Exit) 99 %   Rating of Perceived Exertion (Exercise) 13   Perceived Dyspnea (Exercise) 0   Duration Progress to 45 minutes of aerobic exercise without signs/symptoms of physical distress   Intensity THRR unchanged   Progression   Progression Continue to progress workloads to maintain intensity without signs/symptoms of physical distress.   Resistance Training   Training Prescription Yes   Weight orange abnds   Reps 10-12   Interval Training   Interval Training No   NuStep   Level 5   Minutes 30   METs 1.5   Home Exercise Plan   Plans to continue exercise at Home  plans to buy treadmill to exercise at home post discharge     Goals Met:   Improved SOB with ADL's Using PLB without cueing & demonstrates good technique Exercise tolerated well No report of cardiac concerns or symptoms Strength training completed today  Goals Unmet:  Not Applicable  Comments: Service time is from 1330 to 1545    Dr. Rush Farmer is Medical Director for Pulmonary Rehab at [facility].

## 2015-09-13 ENCOUNTER — Encounter (HOSPITAL_COMMUNITY)
Admission: RE | Admit: 2015-09-13 | Discharge: 2015-09-13 | Disposition: A | Discharge status: Home / Self Care | Payer: Medicare (Managed Care) | Source: Ambulatory Visit | Attending: Pulmonary Disease | Admitting: Pulmonary Disease

## 2015-09-13 NOTE — Progress Notes (Addendum)
Pulmonary Rehab Discharge Note  Meagan Dawson has been discharged from pulmonary rehab.  She attended 16 exercise sessions and greatly improved her strength, stamina and quality of life.  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  She definitely met her program goals which were to gain her strength back so she could return to [place] to live independently.  She was unable to gain weight which was a goal she did not meet.  She states has always been small , she has maintained her weight. She left the program early because she felt she was ready to return to [place].  She was a joy to have in the program.

## 2015-09-15 ENCOUNTER — Encounter (HOSPITAL_COMMUNITY): Payer: Medicare (Managed Care)

## 2015-09-20 ENCOUNTER — Encounter (HOSPITAL_COMMUNITY): Payer: Medicare (Managed Care)

## 2015-09-22 ENCOUNTER — Encounter (HOSPITAL_COMMUNITY): Payer: Medicare (Managed Care)

## 2015-09-27 ENCOUNTER — Encounter (HOSPITAL_COMMUNITY): Payer: Medicare (Managed Care)

## 2015-09-29 ENCOUNTER — Encounter (HOSPITAL_COMMUNITY): Payer: Self-pay

## 2015-10-04 ENCOUNTER — Encounter (HOSPITAL_COMMUNITY): Payer: Self-pay

## 2015-10-06 ENCOUNTER — Encounter (HOSPITAL_COMMUNITY): Payer: Self-pay

## 2015-10-11 ENCOUNTER — Encounter (HOSPITAL_COMMUNITY): Payer: Self-pay

## 2015-10-13 ENCOUNTER — Encounter (HOSPITAL_COMMUNITY): Payer: Self-pay

## 2015-10-18 ENCOUNTER — Encounter (HOSPITAL_COMMUNITY): Payer: Self-pay

## 2015-10-20 ENCOUNTER — Encounter (HOSPITAL_COMMUNITY): Payer: Self-pay

## 2015-10-25 ENCOUNTER — Encounter (HOSPITAL_COMMUNITY): Payer: Self-pay

## 2015-10-27 ENCOUNTER — Encounter (HOSPITAL_COMMUNITY): Payer: Self-pay

## 2015-11-01 ENCOUNTER — Encounter (HOSPITAL_COMMUNITY): Payer: Self-pay

## 2015-11-03 ENCOUNTER — Encounter (HOSPITAL_COMMUNITY): Payer: Self-pay

## 2015-11-08 ENCOUNTER — Encounter (HOSPITAL_COMMUNITY): Payer: Self-pay

## 2016-06-15 ENCOUNTER — Encounter (HOSPITAL_BASED_OUTPATIENT_CLINIC_OR_DEPARTMENT_OTHER): Payer: Self-pay | Admitting: Emergency Medicine

## 2016-06-15 ENCOUNTER — Emergency Department (HOSPITAL_BASED_OUTPATIENT_CLINIC_OR_DEPARTMENT_OTHER)
Admission: EM | Admit: 2016-06-15 | Discharge: 2016-06-15 | Disposition: A | Discharge status: Home / Self Care | Payer: Medicare (Managed Care) | Attending: Emergency Medicine | Admitting: Emergency Medicine

## 2016-06-15 DIAGNOSIS — E119 Type 2 diabetes mellitus without complications: Secondary | ICD-10-CM | POA: Diagnosis not present

## 2016-06-15 DIAGNOSIS — J449 Chronic obstructive pulmonary disease, unspecified: Secondary | ICD-10-CM | POA: Diagnosis not present

## 2016-06-15 DIAGNOSIS — I10 Essential (primary) hypertension: Secondary | ICD-10-CM | POA: Diagnosis not present

## 2016-06-15 DIAGNOSIS — Z87891 Personal history of nicotine dependence: Secondary | ICD-10-CM | POA: Diagnosis not present

## 2016-06-15 DIAGNOSIS — R531 Weakness: Secondary | ICD-10-CM | POA: Diagnosis present

## 2016-06-15 DIAGNOSIS — R21 Rash and other nonspecific skin eruption: Secondary | ICD-10-CM | POA: Diagnosis not present

## 2016-06-15 DIAGNOSIS — Z79899 Other long term (current) drug therapy: Secondary | ICD-10-CM | POA: Insufficient documentation

## 2016-06-15 HISTORY — DX: Type 2 diabetes mellitus without complications: E11.9

## 2016-06-15 LAB — URINALYSIS, MICROSCOPIC (REFLEX): WBC, UA: NONE SEEN WBC/hpf (ref 0–5)

## 2016-06-15 LAB — HEPATIC FUNCTION PANEL
ALT: 13 U/L — ABNORMAL LOW (ref 14–54)
AST: 24 U/L (ref 15–41)
Albumin: 3.8 g/dL (ref 3.5–5.0)
Alkaline Phosphatase: 287 U/L — ABNORMAL HIGH (ref 38–126)
BILIRUBIN DIRECT: 0.1 mg/dL (ref 0.1–0.5)
BILIRUBIN TOTAL: 0.5 mg/dL (ref 0.3–1.2)
Indirect Bilirubin: 0.4 mg/dL (ref 0.3–0.9)
Total Protein: 6.7 g/dL (ref 6.5–8.1)

## 2016-06-15 LAB — BASIC METABOLIC PANEL
Anion gap: 7 (ref 5–15)
BUN: 40 mg/dL — AB (ref 6–20)
CALCIUM: 8.6 mg/dL — AB (ref 8.9–10.3)
CHLORIDE: 103 mmol/L (ref 101–111)
CO2: 25 mmol/L (ref 22–32)
CREATININE: 1.86 mg/dL — AB (ref 0.44–1.00)
GFR calc Af Amer: 31 mL/min — ABNORMAL LOW (ref >60)
GFR calc non Af Amer: 27 mL/min — ABNORMAL LOW (ref >60)
Glucose, Bld: 164 mg/dL — ABNORMAL HIGH (ref 65–99)
Potassium: 4.9 mmol/L (ref 3.5–5.1)
SODIUM: 135 mmol/L (ref 135–145)

## 2016-06-15 LAB — URINALYSIS, ROUTINE W REFLEX MICROSCOPIC
GLUCOSE, UA: NEGATIVE mg/dL
KETONES UR: NEGATIVE mg/dL
Leukocytes, UA: NEGATIVE
Nitrite: NEGATIVE
PROTEIN: 100 mg/dL — AB
Specific Gravity, Urine: 1.019 (ref 1.005–1.030)
pH: 5.5 (ref 5.0–8.0)

## 2016-06-15 LAB — CBC
HCT: 37.7 % (ref 36.0–46.0)
Hemoglobin: 12.4 g/dL (ref 12.0–15.0)
MCH: 30.5 pg (ref 26.0–34.0)
MCHC: 32.9 g/dL (ref 30.0–36.0)
MCV: 92.6 fL (ref 78.0–100.0)
PLATELETS: 258 10*3/uL (ref 150–400)
RBC: 4.07 MIL/uL (ref 3.87–5.11)
RDW: 13.3 % (ref 11.5–15.5)
WBC: 6.5 10*3/uL (ref 4.0–10.5)

## 2016-06-15 LAB — MAGNESIUM: MAGNESIUM: 2.4 mg/dL (ref 1.7–2.4)

## 2016-06-15 LAB — CBG MONITORING, ED: GLUCOSE-CAPILLARY: 157 mg/dL — AB (ref 65–99)

## 2016-06-15 MED ORDER — VALACYCLOVIR HCL 500 MG PO TABS: 1000.0000 mg | ORAL_TABLET | Freq: Every day | ORAL | 0 refills | Status: DC

## 2016-06-15 MED ORDER — HYDROCODONE-ACETAMINOPHEN 5-325 MG PO TABS: 1.0000 | ORAL_TABLET | Freq: Four times a day (QID) | ORAL | 0 refills | Status: AC | PRN

## 2016-06-15 MED ORDER — SODIUM CHLORIDE 0.9 % IV BOLUS (SEPSIS)
1000.0000 mL | Freq: Once | INTRAVENOUS | Status: AC
Start: 2016-06-15 — End: 2016-06-15
  Administered 2016-06-15: 1000 mL via INTRAVENOUS

## 2016-06-15 MED FILL — HYDROCODON-APAP 5-325: 5-325 | 4 days supply | Qty: 15 | Fill #0

## 2016-06-15 MED FILL — VALACYCLOVIR HCL 500 MG TAB: 500 | 7 days supply | Qty: 14 | Fill #0

## 2016-06-15 NOTE — ED Triage Notes (Addendum)
Pt reports decreased appetite, lethargy, weakness since Monday.  Denies fevers, cough, chest pain, shortness of breath.  Patient has rash to LLQ which she noticed yesterday.  Reports intermittent pain to this area.  Patient ambulatory to triage in NAD, answering all questions appropriately.

## 2016-06-15 NOTE — Discharge Instructions (Signed)
Make sure that you drink at least six 8 ounce glasses of water or sugar free Gatorade each day in order to stay well-hydrated. . Take Tylenol for mild pain or the pain medication prescribed for bad pain. Don't take Tylenol together with the pain medication prescribed as the combination can be dangerous to your liver. Your blood sugar was mildly elevated today at 164. Blood creatinine which is a reflection of how well your kidneys are functioning is 1.86, which is similar to values you've had in the past. Return if concern for any reason or call your physicians in [place]

## 2016-06-15 NOTE — ED Notes (Signed)
Patient was provided a labeled urine collection cup but advised that she could not give a specimen right now.

## 2016-06-15 NOTE — ED Notes (Signed)
CBG 157 

## 2016-06-15 NOTE — ED Provider Notes (Addendum)
[place] DEPT [facility] Provider Note   CSN: [id number] Arrival date & time: 06/15/16  1154     History   Chief Complaint Chief Complaint  Patient presents with  . Weakness    HPI Meagan Dawson is a 66 y.o. female.  HPI Complains of generalized weakness and decreased appetite for the past 5 days. She complains of mild left flank pain and a rash on left flank and left lower quadrant onset yesterday. She denies fever denies pain other than slight pain at site of rash which she feels might be shingles. No treatment prior to coming here. No cough no shortness of breath no nausea or vomiting no urinary symptoms. No treatment prior to coming here nothing makes symptoms better or worse Past Medical History:  Diagnosis Date  . COPD (chronic obstructive pulmonary disease) ([place])   . Diabetes mellitus without complication ([place])   . Eczema   . Hyperlipidemia   . Hypertension     There are no active problems to display for this patient.   Past Surgical History:  Procedure Laterality Date  . LUNG TRANSPLANT, DOUBLE      OB History    No data available       Home Medications    Prior to Admission medications   Medication Sig Start Date End Date Taking? Authorizing Provider  acetaminophen (TYLENOL) 325 MG tablet Take 650 mg by mouth every 6 (six) hours as needed.    Historical Provider, MD  amLODipine (NORVASC) 10 MG tablet Take 10 mg by mouth daily. Reported on 08/02/2015    Historical Provider, MD  bacitracin 500 UNIT/GM ointment Apply 1 application topically 2 (two) times daily.    Historical Provider, MD  lansoprazole (PREVACID SOLUTAB) 30 MG disintegrating tablet Take 30 mg by mouth daily.    Historical Provider, MD  loperamide (IMODIUM A-D) 2 MG tablet Take 2 mg by mouth 4 (four) times daily as needed for diarrhea or loose stools.    Historical Provider, MD  magnesium oxide (MAG-OX) 400 MG tablet Take 400 mg by mouth 3 (three) times daily.    Historical Provider, MD    Melatonin 3 MG CAPS Take 3 mg by mouth at bedtime as needed.    Historical Provider, MD  metoprolol tartrate (LOPRESSOR) 25 MG tablet Take 12.5 mg by mouth 2 (two) times daily.    Historical Provider, MD  mycophenolate (CELLCEPT) 500 MG tablet Take 500 mg by mouth 2 (two) times daily.    Historical Provider, MD  predniSONE (DELTASONE) 5 MG tablet Take 5 mg by mouth daily with breakfast. 2 tablets by mouth daily    Historical Provider, MD  sodium bicarbonate 650 MG tablet Take 650 mg by mouth 3 (three) times daily.    Historical Provider, MD  sulfamethoxazole-trimethoprim (BACTRIM,SEPTRA) 400-80 MG tablet Take 1 tablet by mouth 2 (two) times a week. On Monday and Thursday    Historical Provider, MD  tacrolimus (PROGRAF) 1 MG capsule Take 7 mg by mouth every 12 (twelve) hours.    Historical Provider, MD  valGANciclovir (VALCYTE) 450 MG tablet Take 450 mg by mouth daily.    Historical Provider, MD    Family History History reviewed. No pertinent family history.  Social History Social History  Substance Use Topics  . Smoking status: Former Smoker    Packs/day: 1.50    Types: Cigarettes    Start date: 04/30/1962    Quit date: 12/30/1999  . Smokeless tobacco: Never Used  . Alcohol use No  Allergies   Patient has no known allergies.   Review of Systems Review of Systems  Constitutional: Positive for appetite change.  HENT: Negative.   Respiratory: Negative.   Cardiovascular: Negative.   Gastrointestinal: Negative.   Musculoskeletal: Negative.   Skin: Positive for rash.  Allergic/Immunologic: Positive for immunocompromised state.  Neurological: Positive for weakness.  Psychiatric/Behavioral: Negative.   All other systems reviewed and are negative.    Physical Exam Updated Vital Signs BP 132/75 (BP Location: Right Arm)   Pulse 79   Temp 98 F (36.7 C) (Oral)   Resp 20   Ht 5\' 1"  (1.549 m)   Wt 81 lb (36.7 kg)   SpO2 97%   BMI 15.30 kg/m   Physical Exam   Constitutional: She appears well-developed and well-nourished. No distress.  HENT:  Head: Normocephalic and atraumatic.  Eyes: Conjunctivae are normal. Pupils are equal, round, and reactive to light.  Neck: Neck supple. No tracheal deviation present. No thyromegaly present.  Cardiovascular: Normal rate and regular rhythm.   No murmur heard. Pulmonary/Chest: Effort normal and breath sounds normal.  Abdominal: Soft. Bowel sounds are normal. She exhibits no distension. There is no tenderness.  Musculoskeletal: Normal range of motion. She exhibits no edema or tenderness.  Neurological: She is alert. Coordination normal.  Skin: Skin is warm and dry. No rash noted.  Rest at left flank and left groin with diffuse scabbed lesions typical of herpes zoster  Psychiatric: She has a normal mood and affect.  Nursing note and vitals reviewed.    ED Treatments / Results  Labs (all labs ordered are listed, but only abnormal results are displayed) Labs Reviewed  CBG MONITORING, ED - Abnormal; Notable for the following:       Result Value   Glucose-Capillary 157 (*)    All other components within normal limits  CBC  BASIC METABOLIC PANEL  URINALYSIS, ROUTINE W REFLEX MICROSCOPIC  HEPATIC FUNCTION PANEL  MAGNESIUM    EKG  EKG Interpretation  Date/Time:  Friday June 15 2016 12:13:17 EST Ventricular Rate:  71 PR Interval:  128 QRS Duration: 66 QT Interval:  450 QTC Calculation: 489 R Axis:   33 Text Interpretation:  Normal sinus rhythm Possible Left atrial enlargement Borderline ECG No old tracing to compare Confirmed by Winfred Leeds  MD, Konner Saiz [phone]) on 06/15/2016 12:21:31 PM       Radiology No results found.  Procedures Procedures (including critical care time)  Medications Ordered in ED Medications  sodium chloride 0.9 % bolus 1,000 mL (not administered)    Results for orders placed or performed during the hospital encounter of 01/77/93  Basic metabolic panel  Result Value  Ref Range   Sodium 135 135 - 145 mmol/L   Potassium 4.9 3.5 - 5.1 mmol/L   Chloride 103 101 - 111 mmol/L   CO2 25 22 - 32 mmol/L   Glucose, Bld 164 (H) 65 - 99 mg/dL   BUN 40 (H) 6 - 20 mg/dL   Creatinine, Ser 1.86 (H) 0.44 - 1.00 mg/dL   Calcium 8.6 (L) 8.9 - 10.3 mg/dL   GFR calc non Af Amer 27 (L) >60 mL/min   GFR calc Af Amer 31 (L) >60 mL/min   Anion gap 7 5 - 15  CBC  Result Value Ref Range   WBC 6.5 4.0 - 10.5 K/uL   RBC 4.07 3.87 - 5.11 MIL/uL   Hemoglobin 12.4 12.0 - 15.0 g/dL   HCT 37.7 36.0 - 46.0 %   MCV  92.6 78.0 - 100.0 fL   MCH 30.5 26.0 - 34.0 pg   MCHC 32.9 30.0 - 36.0 g/dL   RDW 13.3 11.5 - 15.5 %   Platelets 258 150 - 400 K/uL  Urinalysis, Routine w reflex microscopic  Result Value Ref Range   Color, Urine YELLOW YELLOW   APPearance CLOUDY (A) CLEAR   Specific Gravity, Urine 1.019 1.005 - 1.030   pH 5.5 5.0 - 8.0   Glucose, UA NEGATIVE NEGATIVE mg/dL   Hgb urine dipstick SMALL (A) NEGATIVE   Bilirubin Urine SMALL (A) NEGATIVE   Ketones, ur NEGATIVE NEGATIVE mg/dL   Protein, ur 100 (A) NEGATIVE mg/dL   Nitrite NEGATIVE NEGATIVE   Leukocytes, UA NEGATIVE NEGATIVE  Hepatic function panel  Result Value Ref Range   Total Protein 6.7 6.5 - 8.1 g/dL   Albumin 3.8 3.5 - 5.0 g/dL   AST 24 15 - 41 U/L   ALT 13 (L) 14 - 54 U/L   Alkaline Phosphatase 287 (H) 38 - 126 U/L   Total Bilirubin 0.5 0.3 - 1.2 mg/dL   Bilirubin, Direct 0.1 0.1 - 0.5 mg/dL   Indirect Bilirubin 0.4 0.3 - 0.9 mg/dL  Magnesium  Result Value Ref Range   Magnesium 2.4 1.7 - 2.4 mg/dL  Urinalysis, Microscopic (reflex)  Result Value Ref Range   RBC / HPF 0-5 0 - 5 RBC/hpf   WBC, UA NONE SEEN 0 - 5 WBC/hpf   Bacteria, UA RARE (A) NONE SEEN   Squamous Epithelial / LPF 0-5 (A) NONE SEEN   Mucous PRESENT   CBG monitoring, ED  Result Value Ref Range   Glucose-Capillary 157 (H) 65 - 99 mg/dL   No results found. Initial Impression / Assessment and Plan / ED Course  I have reviewed the  triage vital signs and the nursing notes.  Pertinent labs & imaging results that were available during my care of the patient were reviewed by me and considered in my medical decision making (see chart for details).     She feels improved after intravenous hydration. He also ate a small meal while here. Her daughter showed me previous lab work, renal function is approximately at baseline. Plan prescriptions Valtrex, Norco. Encourage oral hydration. Return as needed or follow up with PMD via telephone. She is visiting here from [place]. [place] Controlled Substance reporting System queried Final Clinical Impressions(s) / ED Diagnoses  Diagnosis #1 herpes zoster #2 generalized weakness #3 mild dehydration #4chronic renal insufficiency #5 hyperglycemia Final diagnoses:  None    New Prescriptions New Prescriptions   No medications on file     Orlie Dakin, MD 06/15/16 [place], MD 06/15/16 [place], MD 06/15/16 1635

## 2019-05-11 ENCOUNTER — Other Ambulatory Visit: Payer: Self-pay

## 2019-05-11 ENCOUNTER — Emergency Department (HOSPITAL_BASED_OUTPATIENT_CLINIC_OR_DEPARTMENT_OTHER): Payer: Medicare Other

## 2019-05-11 ENCOUNTER — Emergency Department (HOSPITAL_BASED_OUTPATIENT_CLINIC_OR_DEPARTMENT_OTHER)
Admission: EM | Admit: 2019-05-11 | Discharge: 2019-05-12 | Disposition: A | Discharge status: Home / Self Care | Payer: Medicare Other | Attending: Emergency Medicine | Admitting: Emergency Medicine

## 2019-05-11 ENCOUNTER — Encounter (HOSPITAL_BASED_OUTPATIENT_CLINIC_OR_DEPARTMENT_OTHER): Payer: Self-pay | Admitting: *Deleted

## 2019-05-11 DIAGNOSIS — J449 Chronic obstructive pulmonary disease, unspecified: Secondary | ICD-10-CM | POA: Diagnosis not present

## 2019-05-11 DIAGNOSIS — Z942 Lung transplant status: Secondary | ICD-10-CM | POA: Insufficient documentation

## 2019-05-11 DIAGNOSIS — Y939 Activity, unspecified: Secondary | ICD-10-CM | POA: Insufficient documentation

## 2019-05-11 DIAGNOSIS — W2203XA Walked into furniture, initial encounter: Secondary | ICD-10-CM | POA: Diagnosis not present

## 2019-05-11 DIAGNOSIS — Y999 Unspecified external cause status: Secondary | ICD-10-CM | POA: Insufficient documentation

## 2019-05-11 DIAGNOSIS — Z87891 Personal history of nicotine dependence: Secondary | ICD-10-CM | POA: Insufficient documentation

## 2019-05-11 DIAGNOSIS — Z23 Encounter for immunization: Secondary | ICD-10-CM | POA: Diagnosis not present

## 2019-05-11 DIAGNOSIS — I10 Essential (primary) hypertension: Secondary | ICD-10-CM | POA: Insufficient documentation

## 2019-05-11 DIAGNOSIS — Y92003 Bedroom of unspecified non-institutional (private) residence as the place of occurrence of the external cause: Secondary | ICD-10-CM | POA: Diagnosis not present

## 2019-05-11 DIAGNOSIS — E119 Type 2 diabetes mellitus without complications: Secondary | ICD-10-CM | POA: Diagnosis not present

## 2019-05-11 DIAGNOSIS — Z992 Dependence on renal dialysis: Secondary | ICD-10-CM | POA: Diagnosis not present

## 2019-05-11 DIAGNOSIS — S81811A Laceration without foreign body, right lower leg, initial encounter: Secondary | ICD-10-CM

## 2019-05-11 DIAGNOSIS — Z79899 Other long term (current) drug therapy: Secondary | ICD-10-CM | POA: Diagnosis not present

## 2019-05-11 NOTE — Progress Notes (Signed)
Pt want to wait until seeing MD before continuing with X-ray; states "it's just torn skin, not sure I need it"

## 2019-05-11 NOTE — ED Notes (Signed)
Patient refused xray prior to seeing MD

## 2019-05-11 NOTE — ED Triage Notes (Signed)
Laceration to her right lower leg on a bed frame.

## 2019-05-12 ENCOUNTER — Encounter (HOSPITAL_BASED_OUTPATIENT_CLINIC_OR_DEPARTMENT_OTHER): Payer: Self-pay | Admitting: Emergency Medicine

## 2019-05-12 ENCOUNTER — Emergency Department (HOSPITAL_BASED_OUTPATIENT_CLINIC_OR_DEPARTMENT_OTHER): Payer: Medicare Other

## 2019-05-12 MED ORDER — TETANUS-DIPHTH-ACELL PERTUSSIS 5-2.5-18.5 LF-MCG/0.5 IM SUSP
0.5000 mL | Freq: Once | INTRAMUSCULAR | Status: AC
  Administered 2019-05-12: 0.5 mL via INTRAMUSCULAR
  Filled 2019-05-12: qty 0.5

## 2019-05-12 MED ORDER — DOXYCYCLINE HYCLATE 100 MG PO CAPS: 100.0000 mg | ORAL_CAPSULE | Freq: Two times a day (BID) | ORAL | 0 refills | Status: DC

## 2019-05-12 MED ORDER — LIDOCAINE-EPINEPHRINE-TETRACAINE (LET) TOPICAL GEL
3.0000 mL | Freq: Once | TOPICAL | Status: AC
  Administered 2019-05-12: 3 mL via TOPICAL
  Filled 2019-05-12: qty 3

## 2019-05-12 MED ORDER — ACETAMINOPHEN 500 MG PO TABS
1000.0000 mg | ORAL_TABLET | Freq: Once | ORAL | Status: AC
  Administered 2019-05-12: 1000 mg via ORAL
  Filled 2019-05-12: qty 2

## 2019-05-12 MED ORDER — DOXYCYCLINE HYCLATE 100 MG PO TABS
100.0000 mg | ORAL_TABLET | Freq: Once | ORAL | Status: AC
  Administered 2019-05-12: 100 mg via ORAL
  Filled 2019-05-12: qty 1

## 2019-05-12 MED ORDER — MUPIROCIN CALCIUM 2 % EX CREA: 1.0000 "application " | TOPICAL_CREAM | Freq: Two times a day (BID) | CUTANEOUS | 0 refills | Status: DC

## 2019-05-12 NOTE — ED Notes (Signed)
Patient transported to X-ray 

## 2019-05-12 NOTE — ED Notes (Signed)
Povidone-iodine swabs and chlorhexidine wipes used to clean wound by MD.

## 2019-05-12 NOTE — ED Provider Notes (Signed)
[facility] EMERGENCY DEPARTMENT Provider Note   CSN: [id number] Arrival date & time: 05/11/19  2216     History Chief Complaint  Patient presents with  . Laceration    Meagan Dawson is a 69 y.o. female.  The history is provided by the patient.  Laceration Location:  Leg Leg laceration location:  R lower leg Length:  5 Depth:  Through dermis Quality comment:  Upside down v shape Bleeding: controlled   Time since incident:  1 hour Injury mechanism: bed frame. Pain details:    Quality:  Aching   Severity:  Mild   Timing:  Constant   Progression:  Unchanged Foreign body present:  No foreign bodies Relieved by:  Nothing Worsened by:  Nothing Ineffective treatments:  None tried Tetanus status:  Unknown Associated symptoms: no fever and no focal weakness   Patient on dialysis who presents with v shape laceration to the mid shin R after hitting on bedframe.       Past Medical History:  Diagnosis Date  . COPD (chronic obstructive pulmonary disease) ([place])   . Diabetes mellitus without complication ([place])   . Eczema   . Hyperlipidemia   . Hypertension     There are no problems to display for this patient.   Past Surgical History:  Procedure Laterality Date  . LUNG TRANSPLANT, DOUBLE       OB History   No obstetric history on file.     History reviewed. No pertinent family history.  Social History   Tobacco Use  . Smoking status: Former Smoker    Packs/day: 1.50    Types: Cigarettes    Start date: 04/30/1962    Quit date: 12/30/1999    Years since quitting: 19.3  . Smokeless tobacco: Never Used  Substance Use Topics  . Alcohol use: No    Alcohol/week: 0.0 standard drinks  . Drug use: No    Home Medications Prior to Admission medications   Medication Sig Start Date End Date Taking? Authorizing Provider  acetaminophen (TYLENOL) 325 MG tablet Take 650 mg by mouth every 6 (six) hours as needed.    [provider]  amLODipine (NORVASC)  10 MG tablet Take 10 mg by mouth daily. Reported on 08/02/2015    [provider]  bacitracin 500 UNIT/GM ointment Apply 1 application topically 2 (two) times daily.    [provider]  doxycycline (VIBRAMYCIN) 100 MG capsule Take 1 capsule (100 mg total) by mouth 2 (two) times daily. One po bid x 7 days 05/12/19   Dymond Gutt, MD  HYDROcodone-acetaminophen (NORCO) 5-325 MG tablet Take 1 tablet by mouth every 6 (six) hours as needed. 06/15/16   Orlie Dakin, MD  lansoprazole (PREVACID SOLUTAB) 30 MG disintegrating tablet Take 30 mg by mouth daily.    [provider]  loperamide (IMODIUM A-D) 2 MG tablet Take 2 mg by mouth 4 (four) times daily as needed for diarrhea or loose stools.    [provider]  magnesium oxide (MAG-OX) 400 MG tablet Take 400 mg by mouth 3 (three) times daily.    [provider]  Melatonin 3 MG CAPS Take 3 mg by mouth at bedtime as needed.    [provider]  metoprolol tartrate (LOPRESSOR) 25 MG tablet Take 12.5 mg by mouth 2 (two) times daily.    [provider]  mupirocin cream (BACTROBAN) 2 % Apply 1 application topically 2 (two) times daily. 05/12/19   Deyra Perdomo, MD  mycophenolate (CELLCEPT) 500  MG tablet Take 500 mg by mouth 2 (two) times daily.    [provider]  predniSONE (DELTASONE) 5 MG tablet Take 5 mg by mouth daily with breakfast. 2 tablets by mouth daily    [provider]  sodium bicarbonate 650 MG tablet Take 650 mg by mouth 3 (three) times daily.    [provider]  sulfamethoxazole-trimethoprim (BACTRIM,SEPTRA) 400-80 MG tablet Take 1 tablet by mouth 2 (two) times a week. On Monday and Thursday    [provider]  tacrolimus (PROGRAF) 1 MG capsule Take 7 mg by mouth every 12 (twelve) hours.    [provider]  valACYclovir (VALTREX) 500 MG tablet Take 2 tablets (1,000 mg total) by mouth daily. 06/15/16   Orlie Dakin, MD  valGANciclovir  (VALCYTE) 450 MG tablet Take 450 mg by mouth daily.    [provider]    Allergies    Amoxicillin  Review of Systems   Review of Systems  Constitutional: Negative for fever.  HENT: Negative for congestion.   Eyes: Negative for visual disturbance.  Respiratory: Negative for shortness of breath.   Cardiovascular: Negative for chest pain.  Gastrointestinal: Negative for abdominal pain.  Genitourinary: Negative for difficulty urinating.  Skin: Positive for wound.  Neurological: Negative for dizziness and focal weakness.  Psychiatric/Behavioral: Negative for agitation.  All other systems reviewed and are negative.   Physical Exam Updated Vital Signs BP 140/60 (BP Location: Right Arm)   Pulse 83   Temp 98.7 F (37.1 C) (Oral)   Resp 17   Ht 5' (1.524 m)   Wt 38.6 kg   SpO2 98%   BMI 16.60 kg/m   Physical Exam Vitals and nursing note reviewed.  Constitutional:      Appearance: Normal appearance. She is not ill-appearing.  HENT:     Head: Normocephalic and atraumatic.     Nose: Nose normal.  Eyes:     Conjunctiva/sclera: Conjunctivae normal.     Pupils: Pupils are equal, round, and reactive to light.  Cardiovascular:     Rate and Rhythm: Normal rate and regular rhythm.     Pulses: Normal pulses.     Heart sounds: Normal heart sounds.  Pulmonary:     Effort: Pulmonary effort is normal.     Breath sounds: Normal breath sounds.  Abdominal:     General: Abdomen is flat. Bowel sounds are normal.     Tenderness: There is no abdominal tenderness. There is no guarding.  Musculoskeletal:     Cervical back: Normal range of motion and neck supple.  Skin:    General: Skin is warm and dry.     Capillary Refill: Capillary refill takes less than 2 seconds.       Neurological:     General: No focal deficit present.     Mental Status: She is alert and oriented to person, place, and time.     Deep Tendon Reflexes: Reflexes normal.  Psychiatric:        Mood and  Affect: Mood normal.        Behavior: Behavior normal.     ED Results / Procedures / Treatments   Labs (all labs ordered are listed, but only abnormal results are displayed) Labs Reviewed - No data to display  EKG None  Radiology DG Tibia/Fibula Right  Result Date: 05/12/2019 CLINICAL DATA:  Laceration hit leg EXAM: RIGHT TIBIA AND FIBULA - 2 VIEW COMPARISON:  None. FINDINGS: There is no evidence of fracture or other focal  bone lesions. Small superficial laceration with subcutaneous edema seen overlying the anterior mid tibial shaft. IMPRESSION: No acute osseous abnormality. Electronically Signed   By: Prudencio Pair M.D.   On: 05/12/2019 00:32    Procedures .Marland KitchenLaceration Repair  Date/Time: 05/12/2019 1:36 AM Performed by: Veatrice Kells, MD Authorized by: Veatrice Kells, MD   Consent:    Consent obtained:  Verbal   Consent given by:  Patient   Risks discussed:  Need for additional repair, nerve damage, infection, pain, poor cosmetic result, poor wound healing, retained foreign body, tendon damage and vascular damage   Alternatives discussed:  No treatment Anesthesia (see MAR for exact dosages):    Anesthesia method:  Topical application   Topical anesthetic:  LET Laceration details:    Location:  Leg   Leg location:  R lower leg   Length (cm):  5   Depth (mm):  1 Repair type:    Repair type:  Intermediate Pre-procedure details:    Preparation:  Patient was prepped and draped in usual sterile fashion Exploration:    Hemostasis achieved with:  Direct pressure   Wound exploration: wound explored through full range of motion     Wound extent: no areolar tissue violation noted, no fascia violation noted, no foreign bodies/material noted and no nerve damage noted     Contaminated: no   Treatment:    Area cleansed with:  Betadine (chlorhexidine)   Amount of cleaning:  Standard   Irrigation solution:  Sterile saline   Irrigation method:  Syringe Skin repair:    Repair  method:  Staples   Number of staples:  9 Approximation:    Approximation:  Close Post-procedure details:    Dressing:  Sterile dressing and antibiotic ointment   Patient tolerance of procedure:  Tolerated well, no immediate complications   (including critical care time)  Medications Ordered in ED Medications  doxycycline (VIBRA-TABS) tablet 100 mg (100 mg Oral Given 05/12/19 0025)  Tdap (BOOSTRIX) injection 0.5 mL (0.5 mLs Intramuscular Given 05/12/19 0026)  acetaminophen (TYLENOL) tablet 1,000 mg (1,000 mg Oral Given 05/12/19 0025)  lidocaine-EPINEPHrine-tetracaine (LET) topical gel (3 mLs Topical Given 05/12/19 0029)    ED Course  I have reviewed the triage vital signs and the nursing notes.  Pertinent labs & imaging results that were available during my care of the patient were reviewed by me and considered in my medical decision making (see chart for details).   Suture removal in 10 days at urgent care.  Wound care instructions given.  Given immune status will cover with topical antibiotics and oral antibiotics.    Tenna Wetherell was evaluated in Emergency Department on 05/12/2019 for the symptoms described in the history of present illness. She was evaluated in the context of the global COVID-19 pandemic, which necessitated consideration that the patient might be at risk for infection with the SARS-CoV-2 virus that causes COVID-19. Institutional protocols and algorithms that pertain to the evaluation of patients at risk for COVID-19 are in a state of rapid change based on information released by regulatory bodies including the CDC and federal and state organizations. These policies and algorithms were followed during the patient's care in the ED.  Final Clinical Impression(s) / ED Diagnoses Final diagnoses:  Leg laceration, right, initial encounter  Return for intractable cough, coughing up blood,fevers >100.4 unrelieved by medication, shortness of breath, intractable vomiting, chest  pain, shortness of breath, weakness,numbness, changes in speech, facial asymmetry,abdominal pain, passing out,Inability to tolerate liquids or food, cough, altered mental  status or any concerns. No signs of systemic illness or infection. The patient is nontoxic-appearing on exam and vital signs are within normal limits.   I have reviewed the triage vital signs and the nursing notes. Pertinent labs &imaging results that were available during my care of the patient were reviewed by me and considered in my medical decision making (see chart for details).  After history, exam, and medical workup I feel the patient has been appropriately medically screened and is safe for discharge home. Pertinent diagnoses were discussed with the patient. Patient was given return  Rx / DC Orders ED Discharge Orders         Ordered    doxycycline (VIBRAMYCIN) 100 MG capsule  2 times daily,   Status:  Discontinued     05/12/19 0135    mupirocin cream (BACTROBAN) 2 %  2 times daily     05/12/19 0135    doxycycline (VIBRAMYCIN) 100 MG capsule  2 times daily     05/12/19 0135           Janis Sol, MD 05/12/19 [phone]

## 2019-09-30 ENCOUNTER — Other Ambulatory Visit: Payer: Self-pay | Admitting: Family Medicine

## 2019-09-30 DIAGNOSIS — Z1231 Encounter for screening mammogram for malignant neoplasm of breast: Secondary | ICD-10-CM

## 2019-10-13 ENCOUNTER — Other Ambulatory Visit: Payer: Self-pay

## 2019-10-13 ENCOUNTER — Ambulatory Visit
Admission: RE | Admit: 2019-10-13 | Discharge: 2019-10-13 | Disposition: A | Discharge status: Home / Self Care | Payer: Medicare Other | Source: Ambulatory Visit | Attending: Family Medicine | Admitting: Family Medicine

## 2019-10-13 DIAGNOSIS — Z1231 Encounter for screening mammogram for malignant neoplasm of breast: Secondary | ICD-10-CM

## 2019-10-19 ENCOUNTER — Other Ambulatory Visit: Payer: Self-pay | Admitting: Family Medicine

## 2019-10-19 DIAGNOSIS — R928 Other abnormal and inconclusive findings on diagnostic imaging of breast: Secondary | ICD-10-CM

## 2019-10-29 ENCOUNTER — Ambulatory Visit: Payer: Medicare Other

## 2019-10-29 ENCOUNTER — Ambulatory Visit
Admission: RE | Admit: 2019-10-29 | Discharge: 2019-10-29 | Disposition: A | Discharge status: Home / Self Care | Payer: Medicare Other | Source: Ambulatory Visit | Attending: Family Medicine | Admitting: Family Medicine

## 2019-10-29 ENCOUNTER — Other Ambulatory Visit: Payer: Self-pay

## 2019-10-29 DIAGNOSIS — R928 Other abnormal and inconclusive findings on diagnostic imaging of breast: Secondary | ICD-10-CM

## 2020-05-02 DIAGNOSIS — D631 Anemia in chronic kidney disease: Secondary | ICD-10-CM | POA: Diagnosis not present

## 2020-05-02 DIAGNOSIS — I10 Essential (primary) hypertension: Secondary | ICD-10-CM | POA: Diagnosis not present

## 2020-05-02 DIAGNOSIS — N186 End stage renal disease: Secondary | ICD-10-CM | POA: Diagnosis not present

## 2020-05-02 DIAGNOSIS — D509 Iron deficiency anemia, unspecified: Secondary | ICD-10-CM | POA: Diagnosis not present

## 2020-05-02 DIAGNOSIS — N2581 Secondary hyperparathyroidism of renal origin: Secondary | ICD-10-CM | POA: Diagnosis not present

## 2020-05-04 DIAGNOSIS — N186 End stage renal disease: Secondary | ICD-10-CM | POA: Diagnosis not present

## 2020-05-04 DIAGNOSIS — E119 Type 2 diabetes mellitus without complications: Secondary | ICD-10-CM | POA: Diagnosis not present

## 2020-05-04 DIAGNOSIS — N2581 Secondary hyperparathyroidism of renal origin: Secondary | ICD-10-CM | POA: Diagnosis not present

## 2020-05-04 DIAGNOSIS — D631 Anemia in chronic kidney disease: Secondary | ICD-10-CM | POA: Diagnosis not present

## 2020-05-04 DIAGNOSIS — I10 Essential (primary) hypertension: Secondary | ICD-10-CM | POA: Diagnosis not present

## 2020-05-04 DIAGNOSIS — D509 Iron deficiency anemia, unspecified: Secondary | ICD-10-CM | POA: Diagnosis not present

## 2020-05-05 DIAGNOSIS — Z79899 Other long term (current) drug therapy: Secondary | ICD-10-CM | POA: Diagnosis not present

## 2020-05-05 DIAGNOSIS — Z942 Lung transplant status: Secondary | ICD-10-CM | POA: Diagnosis not present

## 2020-05-05 DIAGNOSIS — Z298 Encounter for other specified prophylactic measures: Secondary | ICD-10-CM | POA: Diagnosis not present

## 2020-05-05 DIAGNOSIS — T86819 Unspecified complication of lung transplant: Secondary | ICD-10-CM | POA: Diagnosis not present

## 2020-05-06 DIAGNOSIS — D509 Iron deficiency anemia, unspecified: Secondary | ICD-10-CM | POA: Diagnosis not present

## 2020-05-06 DIAGNOSIS — N2581 Secondary hyperparathyroidism of renal origin: Secondary | ICD-10-CM | POA: Diagnosis not present

## 2020-05-06 DIAGNOSIS — I10 Essential (primary) hypertension: Secondary | ICD-10-CM | POA: Diagnosis not present

## 2020-05-06 DIAGNOSIS — D631 Anemia in chronic kidney disease: Secondary | ICD-10-CM | POA: Diagnosis not present

## 2020-05-06 DIAGNOSIS — N186 End stage renal disease: Secondary | ICD-10-CM | POA: Diagnosis not present

## 2020-05-09 DIAGNOSIS — N2581 Secondary hyperparathyroidism of renal origin: Secondary | ICD-10-CM | POA: Diagnosis not present

## 2020-05-09 DIAGNOSIS — N186 End stage renal disease: Secondary | ICD-10-CM | POA: Diagnosis not present

## 2020-05-09 DIAGNOSIS — D509 Iron deficiency anemia, unspecified: Secondary | ICD-10-CM | POA: Diagnosis not present

## 2020-05-09 DIAGNOSIS — I10 Essential (primary) hypertension: Secondary | ICD-10-CM | POA: Diagnosis not present

## 2020-05-09 DIAGNOSIS — D631 Anemia in chronic kidney disease: Secondary | ICD-10-CM | POA: Diagnosis not present

## 2020-05-11 DIAGNOSIS — D509 Iron deficiency anemia, unspecified: Secondary | ICD-10-CM | POA: Diagnosis not present

## 2020-05-11 DIAGNOSIS — N2581 Secondary hyperparathyroidism of renal origin: Secondary | ICD-10-CM | POA: Diagnosis not present

## 2020-05-11 DIAGNOSIS — U071 COVID-19: Secondary | ICD-10-CM | POA: Diagnosis not present

## 2020-05-11 DIAGNOSIS — N186 End stage renal disease: Secondary | ICD-10-CM | POA: Diagnosis not present

## 2020-05-11 DIAGNOSIS — D631 Anemia in chronic kidney disease: Secondary | ICD-10-CM | POA: Diagnosis not present

## 2020-05-11 DIAGNOSIS — I10 Essential (primary) hypertension: Secondary | ICD-10-CM | POA: Diagnosis not present

## 2020-05-12 ENCOUNTER — Encounter: Payer: Self-pay | Admitting: Oncology

## 2020-05-12 ENCOUNTER — Other Ambulatory Visit: Payer: Self-pay | Admitting: Family

## 2020-05-12 ENCOUNTER — Ambulatory Visit (HOSPITAL_COMMUNITY)
Admission: RE | Admit: 2020-05-12 | Discharge: 2020-05-12 | Disposition: A | Discharge status: Home / Self Care | Payer: Medicare Other | Source: Ambulatory Visit | Attending: Pulmonary Disease | Admitting: Pulmonary Disease

## 2020-05-12 ENCOUNTER — Telehealth: Payer: Self-pay | Admitting: Oncology

## 2020-05-12 DIAGNOSIS — Z942 Lung transplant status: Secondary | ICD-10-CM

## 2020-05-12 DIAGNOSIS — U071 COVID-19: Secondary | ICD-10-CM

## 2020-05-12 MED ORDER — ALBUTEROL SULFATE HFA 108 (90 BASE) MCG/ACT IN AERS: 2.0000 | INHALATION_SPRAY | Freq: Once | RESPIRATORY_TRACT | Status: DC | PRN

## 2020-05-12 MED ORDER — METHYLPREDNISOLONE SODIUM SUCC 125 MG IJ SOLR: 125.0000 mg | Freq: Once | INTRAMUSCULAR | Status: DC | PRN

## 2020-05-12 MED ORDER — FAMOTIDINE IN NACL 20-0.9 MG/50ML-% IV SOLN: 20.0000 mg | Freq: Once | INTRAVENOUS | Status: DC | PRN

## 2020-05-12 MED ORDER — SOTROVIMAB 500 MG/8ML IV SOLN
500.0000 mg | Freq: Once | INTRAVENOUS | Status: AC
  Administered 2020-05-12: 500 mg via INTRAVENOUS

## 2020-05-12 MED ORDER — EPINEPHRINE 0.3 MG/0.3ML IJ SOAJ: 0.3000 mg | Freq: Once | INTRAMUSCULAR | Status: DC | PRN

## 2020-05-12 MED ORDER — SODIUM CHLORIDE 0.9 % IV SOLN: INTRAVENOUS | Status: DC | PRN

## 2020-05-12 MED ORDER — DIPHENHYDRAMINE HCL 50 MG/ML IJ SOLN: 50.0000 mg | Freq: Once | INTRAMUSCULAR | Status: DC | PRN

## 2020-05-12 NOTE — Discharge Instructions (Signed)

## 2020-05-12 NOTE — Telephone Encounter (Signed)
I connected by phone with Gardiner Ramus on 05/12/2020 at 12:22 PM to discuss the potential use of a new treatment for mild to moderate COVID-19 viral infection in non-hospitalized patients.  This patient is a 71 y.o. female that meets the FDA criteria for Emergency Use Authorization of COVID monoclonal antibody sotrovimab.  Has a (+) direct SARS-CoV-2 viral test result  Has mild or moderate COVID-19   Is NOT hospitalized due to COVID-19  Is within 10 days of symptom onset  Has at least one of the high risk factor(s) for progression to severe COVID-19 and/or hospitalization as defined in EUA.  Specific high risk criteria : Older age (>/= 72 yo), BMI > 25, Chronic Kidney Disease (CKD), Diabetes and Immunosuppressive Disease or Treatment   I have spoken and communicated the following to the patient or parent/caregiver regarding COVID monoclonal antibody treatment:  1. FDA has authorized the emergency use for the treatment of mild to moderate COVID-19 in adults and pediatric patients with positive results of direct SARS-CoV-2 viral testing who are 48 years of age and older weighing at least 40 kg, and who are at high risk for progressing to severe COVID-19 and/or hospitalization.  2. The significant known and potential risks and benefits of COVID monoclonal antibody, and the extent to which such potential risks and benefits are unknown.  3. Information on available alternative treatments and the risks and benefits of those alternatives, including clinical trials.  4. Patients treated with COVID monoclonal antibody should continue to self-isolate and use infection control measures (e.g., wear mask, isolate, social distance, avoid sharing personal items, clean and disinfect "high touch" surfaces, and frequent handwashing) according to CDC guidelines.   5. The patient or parent/caregiver has the option to accept or refuse COVID monoclonal antibody treatment.  After reviewing this information with  the patient, the patient has agreed to receive one of the available covid 19 monoclonal antibodies and will be provided an appropriate fact sheet prior to infusion. Jacquelin Hawking, NP 05/12/2020 12:22 PM

## 2020-05-12 NOTE — Progress Notes (Signed)
Diagnosis: COVID-19  Physician: Dr. Patrick Wright  Procedure: [facility] Infusion [facility] Med: Sotrovimab infusion - Provided patient with sotrovimab fact sheet for patients, parents, and caregivers prior to infusion.   Complications: No immediate complications noted  Discharge: Discharged home    

## 2020-05-12 NOTE — Progress Notes (Signed)
Patient reviewed Fact Sheet for Patients, Parents, and Caregivers for Emergency Use Authorization (EUA) of Sotrovimab for the Treatment of Coronavirus. Patient also reviewed and is agreeable to the estimated cost of treatment. Patient is agreeable to proceed.   

## 2020-05-13 DIAGNOSIS — D509 Iron deficiency anemia, unspecified: Secondary | ICD-10-CM | POA: Diagnosis not present

## 2020-05-13 DIAGNOSIS — I10 Essential (primary) hypertension: Secondary | ICD-10-CM | POA: Diagnosis not present

## 2020-05-13 DIAGNOSIS — D631 Anemia in chronic kidney disease: Secondary | ICD-10-CM | POA: Diagnosis not present

## 2020-05-13 DIAGNOSIS — N2581 Secondary hyperparathyroidism of renal origin: Secondary | ICD-10-CM | POA: Diagnosis not present

## 2020-05-13 DIAGNOSIS — N186 End stage renal disease: Secondary | ICD-10-CM | POA: Diagnosis not present

## 2020-05-16 DIAGNOSIS — I10 Essential (primary) hypertension: Secondary | ICD-10-CM | POA: Diagnosis not present

## 2020-05-16 DIAGNOSIS — N186 End stage renal disease: Secondary | ICD-10-CM | POA: Diagnosis not present

## 2020-05-16 DIAGNOSIS — N2581 Secondary hyperparathyroidism of renal origin: Secondary | ICD-10-CM | POA: Diagnosis not present

## 2020-05-16 DIAGNOSIS — D509 Iron deficiency anemia, unspecified: Secondary | ICD-10-CM | POA: Diagnosis not present

## 2020-05-16 DIAGNOSIS — D631 Anemia in chronic kidney disease: Secondary | ICD-10-CM | POA: Diagnosis not present

## 2020-05-18 DIAGNOSIS — N2581 Secondary hyperparathyroidism of renal origin: Secondary | ICD-10-CM | POA: Diagnosis not present

## 2020-05-18 DIAGNOSIS — I10 Essential (primary) hypertension: Secondary | ICD-10-CM | POA: Diagnosis not present

## 2020-05-18 DIAGNOSIS — D631 Anemia in chronic kidney disease: Secondary | ICD-10-CM | POA: Diagnosis not present

## 2020-05-18 DIAGNOSIS — D509 Iron deficiency anemia, unspecified: Secondary | ICD-10-CM | POA: Diagnosis not present

## 2020-05-18 DIAGNOSIS — N186 End stage renal disease: Secondary | ICD-10-CM | POA: Diagnosis not present

## 2020-05-20 DIAGNOSIS — I10 Essential (primary) hypertension: Secondary | ICD-10-CM | POA: Diagnosis not present

## 2020-05-20 DIAGNOSIS — N2581 Secondary hyperparathyroidism of renal origin: Secondary | ICD-10-CM | POA: Diagnosis not present

## 2020-05-20 DIAGNOSIS — D631 Anemia in chronic kidney disease: Secondary | ICD-10-CM | POA: Diagnosis not present

## 2020-05-20 DIAGNOSIS — N186 End stage renal disease: Secondary | ICD-10-CM | POA: Diagnosis not present

## 2020-05-20 DIAGNOSIS — D509 Iron deficiency anemia, unspecified: Secondary | ICD-10-CM | POA: Diagnosis not present

## 2020-05-23 ENCOUNTER — Emergency Department (HOSPITAL_COMMUNITY): Payer: Medicare Other

## 2020-05-23 ENCOUNTER — Other Ambulatory Visit: Payer: Self-pay

## 2020-05-23 ENCOUNTER — Encounter (HOSPITAL_COMMUNITY): Payer: Self-pay | Admitting: Emergency Medicine

## 2020-05-23 ENCOUNTER — Inpatient Hospital Stay (HOSPITAL_COMMUNITY)
Admission: EM | Admit: 2020-05-23 | Discharge: 2020-05-26 | DRG: 177 | Disposition: A | Discharge status: Home Health | Payer: Medicare Other | Attending: Internal Medicine | Admitting: Internal Medicine

## 2020-05-23 DIAGNOSIS — U071 COVID-19: Secondary | ICD-10-CM | POA: Diagnosis not present

## 2020-05-23 DIAGNOSIS — J159 Unspecified bacterial pneumonia: Secondary | ICD-10-CM | POA: Diagnosis present

## 2020-05-23 DIAGNOSIS — J189 Pneumonia, unspecified organism: Secondary | ICD-10-CM | POA: Diagnosis present

## 2020-05-23 DIAGNOSIS — J440 Chronic obstructive pulmonary disease with acute lower respiratory infection: Secondary | ICD-10-CM | POA: Diagnosis present

## 2020-05-23 DIAGNOSIS — D849 Immunodeficiency, unspecified: Secondary | ICD-10-CM | POA: Diagnosis not present

## 2020-05-23 DIAGNOSIS — E1165 Type 2 diabetes mellitus with hyperglycemia: Secondary | ICD-10-CM | POA: Diagnosis not present

## 2020-05-23 DIAGNOSIS — D539 Nutritional anemia, unspecified: Secondary | ICD-10-CM | POA: Diagnosis present

## 2020-05-23 DIAGNOSIS — Z79899 Other long term (current) drug therapy: Secondary | ICD-10-CM | POA: Diagnosis not present

## 2020-05-23 DIAGNOSIS — J9601 Acute respiratory failure with hypoxia: Secondary | ICD-10-CM | POA: Diagnosis present

## 2020-05-23 DIAGNOSIS — J1282 Pneumonia due to coronavirus disease 2019: Secondary | ICD-10-CM | POA: Diagnosis present

## 2020-05-23 DIAGNOSIS — E785 Hyperlipidemia, unspecified: Secondary | ICD-10-CM | POA: Diagnosis not present

## 2020-05-23 DIAGNOSIS — J449 Chronic obstructive pulmonary disease, unspecified: Secondary | ICD-10-CM | POA: Diagnosis not present

## 2020-05-23 DIAGNOSIS — Z942 Lung transplant status: Secondary | ICD-10-CM | POA: Diagnosis not present

## 2020-05-23 DIAGNOSIS — T380X5A Adverse effect of glucocorticoids and synthetic analogues, initial encounter: Secondary | ICD-10-CM | POA: Diagnosis not present

## 2020-05-23 DIAGNOSIS — N186 End stage renal disease: Secondary | ICD-10-CM | POA: Diagnosis present

## 2020-05-23 DIAGNOSIS — E1122 Type 2 diabetes mellitus with diabetic chronic kidney disease: Secondary | ICD-10-CM | POA: Diagnosis present

## 2020-05-23 DIAGNOSIS — I248 Other forms of acute ischemic heart disease: Secondary | ICD-10-CM | POA: Diagnosis present

## 2020-05-23 DIAGNOSIS — R5381 Other malaise: Secondary | ICD-10-CM | POA: Diagnosis not present

## 2020-05-23 DIAGNOSIS — I517 Cardiomegaly: Secondary | ICD-10-CM | POA: Diagnosis not present

## 2020-05-23 DIAGNOSIS — D631 Anemia in chronic kidney disease: Secondary | ICD-10-CM | POA: Diagnosis present

## 2020-05-23 DIAGNOSIS — R7989 Other specified abnormal findings of blood chemistry: Secondary | ICD-10-CM | POA: Diagnosis not present

## 2020-05-23 DIAGNOSIS — T86812 Lung transplant infection: Secondary | ICD-10-CM | POA: Diagnosis present

## 2020-05-23 DIAGNOSIS — I700 Atherosclerosis of aorta: Secondary | ICD-10-CM | POA: Diagnosis not present

## 2020-05-23 DIAGNOSIS — R079 Chest pain, unspecified: Secondary | ICD-10-CM | POA: Diagnosis not present

## 2020-05-23 DIAGNOSIS — Z87891 Personal history of nicotine dependence: Secondary | ICD-10-CM

## 2020-05-23 DIAGNOSIS — Z743 Need for continuous supervision: Secondary | ICD-10-CM | POA: Diagnosis not present

## 2020-05-23 DIAGNOSIS — R0689 Other abnormalities of breathing: Secondary | ICD-10-CM | POA: Diagnosis not present

## 2020-05-23 DIAGNOSIS — R6889 Other general symptoms and signs: Secondary | ICD-10-CM | POA: Diagnosis not present

## 2020-05-23 DIAGNOSIS — E876 Hypokalemia: Secondary | ICD-10-CM | POA: Diagnosis not present

## 2020-05-23 DIAGNOSIS — Z7952 Long term (current) use of systemic steroids: Secondary | ICD-10-CM

## 2020-05-23 DIAGNOSIS — I120 Hypertensive chronic kidney disease with stage 5 chronic kidney disease or end stage renal disease: Secondary | ICD-10-CM | POA: Diagnosis not present

## 2020-05-23 DIAGNOSIS — N2581 Secondary hyperparathyroidism of renal origin: Secondary | ICD-10-CM | POA: Diagnosis not present

## 2020-05-23 DIAGNOSIS — I5031 Acute diastolic (congestive) heart failure: Secondary | ICD-10-CM | POA: Diagnosis not present

## 2020-05-23 DIAGNOSIS — R531 Weakness: Secondary | ICD-10-CM | POA: Diagnosis not present

## 2020-05-23 DIAGNOSIS — I10 Essential (primary) hypertension: Secondary | ICD-10-CM | POA: Diagnosis not present

## 2020-05-23 DIAGNOSIS — Z992 Dependence on renal dialysis: Secondary | ICD-10-CM

## 2020-05-23 DIAGNOSIS — D649 Anemia, unspecified: Secondary | ICD-10-CM

## 2020-05-23 DIAGNOSIS — D509 Iron deficiency anemia, unspecified: Secondary | ICD-10-CM | POA: Diagnosis not present

## 2020-05-23 DIAGNOSIS — R0602 Shortness of breath: Secondary | ICD-10-CM | POA: Diagnosis not present

## 2020-05-23 DIAGNOSIS — E1129 Type 2 diabetes mellitus with other diabetic kidney complication: Secondary | ICD-10-CM | POA: Diagnosis not present

## 2020-05-23 LAB — RESPIRATORY PANEL BY PCR

## 2020-05-23 LAB — FERRITIN: Ferritin: 2012 ng/mL — ABNORMAL HIGH (ref 11–307)

## 2020-05-23 LAB — COMPREHENSIVE METABOLIC PANEL
ALT: 12 U/L (ref 0–44)
AST: 20 U/L (ref 15–41)
Albumin: 2.8 g/dL — ABNORMAL LOW (ref 3.5–5.0)
Alkaline Phosphatase: 49 U/L (ref 38–126)
Anion gap: 16 — ABNORMAL HIGH (ref 5–15)
BUN: 11 mg/dL (ref 8–23)
CO2: 26 mmol/L (ref 22–32)
Calcium: 8.1 mg/dL — ABNORMAL LOW (ref 8.9–10.3)
Chloride: 96 mmol/L — ABNORMAL LOW (ref 98–111)
Creatinine, Ser: 3.24 mg/dL — ABNORMAL HIGH (ref 0.44–1.00)
GFR, Estimated: 15 mL/min — ABNORMAL LOW (ref >60)
Glucose, Bld: 187 mg/dL — ABNORMAL HIGH (ref 70–99)
Potassium: 2.7 mmol/L — CL (ref 3.5–5.1)
Sodium: 138 mmol/L (ref 135–145)
Total Bilirubin: 0.9 mg/dL (ref 0.3–1.2)
Total Protein: 5.6 g/dL — ABNORMAL LOW (ref 6.5–8.1)

## 2020-05-23 LAB — FIBRINOGEN: Fibrinogen: 489 mg/dL — ABNORMAL HIGH (ref 210–475)

## 2020-05-23 LAB — CBC WITH DIFFERENTIAL/PLATELET
Abs Immature Granulocytes: 0.12 10*3/uL — ABNORMAL HIGH (ref 0.00–0.07)
Basophils Absolute: 0 10*3/uL (ref 0.0–0.1)
Basophils Relative: 0 %
Eosinophils Absolute: 0 10*3/uL (ref 0.0–0.5)
Eosinophils Relative: 0 %
HCT: 31 % — ABNORMAL LOW (ref 36.0–46.0)
Hemoglobin: 10 g/dL — ABNORMAL LOW (ref 12.0–15.0)
Immature Granulocytes: 2 %
Lymphocytes Relative: 3 %
Lymphs Abs: 0.2 10*3/uL — ABNORMAL LOW (ref 0.7–4.0)
MCH: 34.7 pg — ABNORMAL HIGH (ref 26.0–34.0)
MCHC: 32.3 g/dL (ref 30.0–36.0)
MCV: 107.6 fL — ABNORMAL HIGH (ref 80.0–100.0)
Monocytes Absolute: 0.7 10*3/uL (ref 0.1–1.0)
Monocytes Relative: 10 %
Neutro Abs: 5.9 10*3/uL (ref 1.7–7.7)
Neutrophils Relative %: 85 %
Platelets: 261 10*3/uL (ref 150–400)
RBC: 2.88 MIL/uL — ABNORMAL LOW (ref 3.87–5.11)
RDW: 15.8 % — ABNORMAL HIGH (ref 11.5–15.5)
WBC: 6.9 10*3/uL (ref 4.0–10.5)
nRBC: 0 % (ref 0.0–0.2)

## 2020-05-23 LAB — C-REACTIVE PROTEIN: CRP: 4.6 mg/dL — ABNORMAL HIGH (ref <1.0)

## 2020-05-23 LAB — LACTIC ACID, PLASMA: Lactic Acid, Venous: 1.4 mmol/L (ref 0.5–1.9)

## 2020-05-23 LAB — BRAIN NATRIURETIC PEPTIDE: B Natriuretic Peptide: 1044.6 pg/mL — ABNORMAL HIGH (ref 0.0–100.0)

## 2020-05-23 LAB — TROPONIN I (HIGH SENSITIVITY)
Troponin I (High Sensitivity): 27 ng/L — ABNORMAL HIGH (ref <18)
Troponin I (High Sensitivity): 25 ng/L — ABNORMAL HIGH (ref <18)

## 2020-05-23 LAB — PROCALCITONIN: Procalcitonin: 20.93 ng/mL

## 2020-05-23 LAB — LACTATE DEHYDROGENASE: LDH: 240 U/L — ABNORMAL HIGH (ref 98–192)

## 2020-05-23 LAB — TRIGLYCERIDES: Triglycerides: 422 mg/dL — ABNORMAL HIGH (ref <150)

## 2020-05-23 LAB — SARS CORONAVIRUS 2 BY RT PCR (HOSPITAL ORDER, PERFORMED IN ~~LOC~~ HOSPITAL LAB): SARS Coronavirus 2: POSITIVE — AB

## 2020-05-23 LAB — D-DIMER, QUANTITATIVE: D-Dimer, Quant: 1.33 ug/mL-FEU — ABNORMAL HIGH (ref 0.00–0.50)

## 2020-05-23 MED ORDER — IOHEXOL 350 MG/ML SOLN
100.0000 mL | Freq: Once | INTRAVENOUS | Status: AC | PRN
  Administered 2020-05-23: 53 mL via INTRAVENOUS

## 2020-05-23 MED ORDER — METRONIDAZOLE 500 MG PO TABS
500.0000 mg | ORAL_TABLET | Freq: Three times a day (TID) | ORAL | Status: DC
Start: 2020-05-23 — End: 2020-05-26
  Administered 2020-05-24 – 2020-05-26 (×8): 500 mg via ORAL
  Filled 2020-05-23 (×8): qty 1

## 2020-05-23 MED ORDER — ACETAMINOPHEN 325 MG PO TABS: 650.0000 mg | ORAL_TABLET | Freq: Four times a day (QID) | ORAL | Status: DC | PRN

## 2020-05-23 MED ORDER — SODIUM CHLORIDE 0.9 % IV SOLN
1.0000 g | INTRAVENOUS | Status: DC
  Administered 2020-05-24 – 2020-05-25 (×2): 1 g via INTRAVENOUS
  Filled 2020-05-23 (×3): qty 1

## 2020-05-23 MED ORDER — INSULIN ASPART 100 UNIT/ML ~~LOC~~ SOLN
0.0000 [IU] | Freq: Three times a day (TID) | SUBCUTANEOUS | Status: DC
  Administered 2020-05-24: 4 [IU] via SUBCUTANEOUS
  Administered 2020-05-25: 3 [IU] via SUBCUTANEOUS

## 2020-05-23 MED ORDER — ZINC SULFATE 220 (50 ZN) MG PO CAPS
220.0000 mg | ORAL_CAPSULE | Freq: Every day | ORAL | Status: DC
  Administered 2020-05-24 – 2020-05-26 (×3): 220 mg via ORAL
  Filled 2020-05-23 (×3): qty 1

## 2020-05-23 MED ORDER — HEPARIN SODIUM (PORCINE) 5000 UNIT/ML IJ SOLN: 5000.0000 [IU] | Freq: Three times a day (TID) | INTRAMUSCULAR | Status: DC

## 2020-05-23 MED ORDER — SODIUM CHLORIDE 0.9% FLUSH: 3.0000 mL | INTRAVENOUS | Status: DC | PRN

## 2020-05-23 MED ORDER — SODIUM CHLORIDE 0.9 % IV SOLN
100.0000 mg | Freq: Every day | INTRAVENOUS | Status: DC
  Administered 2020-05-25 – 2020-05-26 (×2): 100 mg via INTRAVENOUS
  Filled 2020-05-23 (×2): qty 20

## 2020-05-23 MED ORDER — POTASSIUM CHLORIDE CRYS ER 20 MEQ PO TBCR
20.0000 meq | EXTENDED_RELEASE_TABLET | Freq: Once | ORAL | Status: AC
  Administered 2020-05-24: 20 meq via ORAL
  Filled 2020-05-23: qty 1

## 2020-05-23 MED ORDER — SODIUM CHLORIDE 0.9 % IV SOLN
200.0000 mg | Freq: Once | INTRAVENOUS | Status: AC
  Administered 2020-05-24: 200 mg via INTRAVENOUS
  Filled 2020-05-23: qty 200

## 2020-05-23 MED ORDER — GUAIFENESIN-DM 100-10 MG/5ML PO SYRP: 10.0000 mL | ORAL_SOLUTION | ORAL | Status: DC | PRN

## 2020-05-23 MED ORDER — INSULIN ASPART 100 UNIT/ML ~~LOC~~ SOLN
0.0000 [IU] | Freq: Every day | SUBCUTANEOUS | Status: DC
  Administered 2020-05-24: 4 [IU] via SUBCUTANEOUS
  Administered 2020-05-25: 3 [IU] via SUBCUTANEOUS

## 2020-05-23 MED ORDER — SODIUM CHLORIDE 0.9 % IV SOLN
1.0000 g | INTRAVENOUS | Status: DC
  Administered 2020-05-23: 1 g via INTRAVENOUS
  Filled 2020-05-23: qty 1

## 2020-05-23 MED ORDER — ALBUTEROL SULFATE HFA 108 (90 BASE) MCG/ACT IN AERS: 2.0000 | INHALATION_SPRAY | RESPIRATORY_TRACT | Status: DC | PRN

## 2020-05-23 MED ORDER — ASCORBIC ACID 500 MG PO TABS
500.0000 mg | ORAL_TABLET | Freq: Every day | ORAL | Status: DC
  Administered 2020-05-24 – 2020-05-26 (×3): 500 mg via ORAL
  Filled 2020-05-23 (×3): qty 1

## 2020-05-23 MED ORDER — HEPARIN SODIUM (PORCINE) 5000 UNIT/ML IJ SOLN
5000.0000 [IU] | Freq: Three times a day (TID) | INTRAMUSCULAR | Status: DC
  Administered 2020-05-24 – 2020-05-26 (×9): 5000 [IU] via SUBCUTANEOUS
  Filled 2020-05-23 (×9): qty 1

## 2020-05-23 MED ORDER — DEXAMETHASONE SODIUM PHOSPHATE 10 MG/ML IJ SOLN
6.0000 mg | INTRAMUSCULAR | Status: DC
  Administered 2020-05-24: 6 mg via INTRAVENOUS
  Filled 2020-05-23: qty 1

## 2020-05-23 MED ORDER — VITAMIN D 25 MCG (1000 UNIT) PO TABS
1000.0000 [IU] | ORAL_TABLET | Freq: Every day | ORAL | Status: DC
  Administered 2020-05-24 – 2020-05-26 (×3): 1000 [IU] via ORAL
  Filled 2020-05-23 (×3): qty 1

## 2020-05-23 MED ORDER — ACETAMINOPHEN 650 MG RE SUPP: 650.0000 mg | Freq: Four times a day (QID) | RECTAL | Status: DC | PRN

## 2020-05-23 NOTE — ED Provider Notes (Signed)
[facility] EMERGENCY DEPARTMENT Provider Note   CSN: [id number] Arrival date & time: 05/23/20  1300     History Chief Complaint  Patient presents with  . Shortness of Breath    Meagan Dawson is a 70 y.o. female.  Presents to ER with concern for shortness of breath.  Patient reports that ever since she had Covid a couple weeks ago, she has been feeling somewhat fatigued, malaise but did not have significant shortness of breath.  Today this morning she noted some shortness of breath and this seemed to be worsening throughout the day.  Improved with rest, worsened with exertion.  Has not had any associated chest pain.  Has had cough that been persistent since the Covid.  She did receive monoclonal antibodies.  She has a history of severe COPD, bilateral lung transplant in 2016 in [place].  HPI     Past Medical History:  Diagnosis Date  . COPD (chronic obstructive pulmonary disease) ([place])   . Diabetes mellitus without complication ([place])   . Eczema   . Hyperlipidemia   . Hypertension     There are no problems to display for this patient.   Past Surgical History:  Procedure Laterality Date  . LUNG TRANSPLANT, DOUBLE       OB History   No obstetric history on file.     Family History  Problem Relation Age of Onset  . Breast cancer Neg Hx     Social History   Tobacco Use  . Smoking status: Former Smoker    Packs/day: 1.50    Types: Cigarettes    Start date: 04/30/1962    Quit date: 12/30/1999    Years since quitting: 20.4  . Smokeless tobacco: Never Used  Substance Use Topics  . Alcohol use: No    Alcohol/week: 0.0 standard drinks  . Drug use: No    Home Medications Prior to Admission medications   Medication Sig Start Date End Date Taking? Authorizing Provider  acetaminophen (TYLENOL) 325 MG tablet Take 650 mg by mouth every 6 (six) hours as needed.    [provider]  amLODipine (NORVASC) 10 MG tablet Take 10 mg by mouth daily.  Reported on 08/02/2015    [provider]  bacitracin 500 UNIT/GM ointment Apply 1 application topically 2 (two) times daily.    [provider]  doxycycline (VIBRAMYCIN) 100 MG capsule Take 1 capsule (100 mg total) by mouth 2 (two) times daily. One po bid x 7 days 05/12/19   Palumbo, April, MD  HYDROcodone-acetaminophen (NORCO) 5-325 MG tablet Take 1 tablet by mouth every 6 (six) hours as needed. 06/15/16   Orlie Dakin, MD  lansoprazole (PREVACID SOLUTAB) 30 MG disintegrating tablet Take 30 mg by mouth daily.    [provider]  loperamide (IMODIUM A-D) 2 MG tablet Take 2 mg by mouth 4 (four) times daily as needed for diarrhea or loose stools.    [provider]  magnesium oxide (MAG-OX) 400 MG tablet Take 400 mg by mouth 3 (three) times daily.    [provider]  Melatonin 3 MG CAPS Take 3 mg by mouth at bedtime as needed.    [provider]  metoprolol tartrate (LOPRESSOR) 25 MG tablet Take 12.5 mg by mouth 2 (two) times daily.    [provider]  mupirocin cream (BACTROBAN) 2 % Apply 1 application topically 2 (two) times daily. 05/12/19   Palumbo, April, MD  mycophenolate (CELLCEPT) 500 MG tablet Take 500 mg by  mouth 2 (two) times daily.    [provider]  predniSONE (DELTASONE) 5 MG tablet Take 5 mg by mouth daily with breakfast. 2 tablets by mouth daily    [provider]  sodium bicarbonate 650 MG tablet Take 650 mg by mouth 3 (three) times daily.    [provider]  sulfamethoxazole-trimethoprim (BACTRIM,SEPTRA) 400-80 MG tablet Take 1 tablet by mouth 2 (two) times a week. On Monday and Thursday    [provider]  tacrolimus (PROGRAF) 1 MG capsule Take 7 mg by mouth every 12 (twelve) hours.    [provider]  valACYclovir (VALTREX) 500 MG tablet Take 2 tablets (1,000 mg total) by mouth daily. 06/15/16   Orlie Dakin, MD  valGANciclovir (VALCYTE) 450 MG tablet Take 450 mg by mouth  daily.    [provider]    Allergies    Amoxicillin  Review of Systems   Review of Systems  Constitutional: Negative for chills and fever.  HENT: Negative for ear pain and sore throat.   Eyes: Negative for pain and visual disturbance.  Respiratory: Positive for shortness of breath. Negative for cough.   Cardiovascular: Negative for chest pain and palpitations.  Gastrointestinal: Negative for abdominal pain and vomiting.  Genitourinary: Negative for dysuria and hematuria.  Musculoskeletal: Negative for arthralgias and back pain.  Skin: Negative for color change and rash.  Neurological: Negative for seizures and syncope.  All other systems reviewed and are negative.   Physical Exam Updated Vital Signs BP (!) 120/54 (BP Location: Right Wrist)   Pulse 68   Temp 98.4 F (36.9 C) (Oral)   Resp (!) 27   Ht '4\' 11"'$  (1.499 m)   Wt 34 kg   SpO2 98%   BMI 15.15 kg/m   Physical Exam  ED Results / Procedures / Treatments   Labs (all labs ordered are listed, but only abnormal results are displayed) Labs Reviewed  SARS CORONAVIRUS 2 BY RT PCR (HOSPITAL ORDER, [facility]) - Abnormal; Notable for the following components:      Result Value   SARS Coronavirus 2 POSITIVE (*)    All other components within normal limits  CBC WITH DIFFERENTIAL/PLATELET - Abnormal; Notable for the following components:   RBC 2.88 (*)    Hemoglobin 10.0 (*)    HCT 31.0 (*)    MCV 107.6 (*)    MCH 34.7 (*)    RDW 15.8 (*)    Lymphs Abs 0.2 (*)    Abs Immature Granulocytes 0.12 (*)    All other components within normal limits  COMPREHENSIVE METABOLIC PANEL - Abnormal; Notable for the following components:   Potassium 2.7 (*)    Chloride 96 (*)    Glucose, Bld 187 (*)    Creatinine, Ser 3.24 (*)    Calcium 8.1 (*)    Total Protein 5.6 (*)    Albumin 2.8 (*)    GFR, Estimated 15 (*)    Anion gap 16 (*)    All other components within normal limits  BRAIN  NATRIURETIC PEPTIDE - Abnormal; Notable for the following components:   B Natriuretic Peptide 1,044.6 (*)    All other components within normal limits  D-DIMER, QUANTITATIVE (NOT AT [facility]) - Abnormal; Notable for the following components:   D-Dimer, Quant 1.33 (*)    All other components within normal limits  LACTATE DEHYDROGENASE - Abnormal; Notable for the following components:   LDH 240 (*)    All other components  within normal limits  FERRITIN - Abnormal; Notable for the following components:   Ferritin 2,012 (*)    All other components within normal limits  FIBRINOGEN - Abnormal; Notable for the following components:   Fibrinogen 489 (*)    All other components within normal limits  C-REACTIVE PROTEIN - Abnormal; Notable for the following components:   CRP 4.6 (*)    All other components within normal limits  TRIGLYCERIDES - Abnormal; Notable for the following components:   Triglycerides 422 (*)    All other components within normal limits  TROPONIN I (HIGH SENSITIVITY) - Abnormal; Notable for the following components:   Troponin I (High Sensitivity) 27 (*)    All other components within normal limits  TROPONIN I (HIGH SENSITIVITY) - Abnormal; Notable for the following components:   Troponin I (High Sensitivity) 25 (*)    All other components within normal limits  RESPIRATORY PANEL BY PCR  CULTURE, BLOOD (ROUTINE X 2)  CULTURE, BLOOD (ROUTINE X 2)  MRSA PCR SCREENING  LACTIC ACID, PLASMA  PROCALCITONIN  LACTIC ACID, PLASMA    EKG EKG Interpretation  Date/Time:  Monday May 23 2020 13:08:22 EST Ventricular Rate:  79 PR Interval:  150 QRS Duration: 76 QT Interval:  446 QTC Calculation: 511 R Axis:   33 Text Interpretation: Sinus rhythm with occasional Premature ventricular complexes and Premature atrial complexes Nonspecific ST abnormality Prolonged QT Abnormal ECG Confirmed by Madalyn Rob [phone]) on 05/23/2020 4:24:25 PM   Radiology DG Chest 2  View  Result Date: 05/23/2020 CLINICAL DATA:  Shortness of breath over the past 2 weeks. EXAM: CHEST - 2 VIEW COMPARISON:  01/18/2020 FINDINGS: Stable enlarged cardiac silhouette and postsurgical changes. The lungs are hyperexpanded with mild prominence of the interstitial markings. Significantly improved bilateral airspace opacity. Minimal bilateral pleural thickening or fluid with significant improvement. IMPRESSION: 1. Significantly improved bilateral pneumonia/edema with minimal residual bilateral pleural thickening or fluid. 2. Stable cardiomegaly and changes of COPD. Electronically Signed   By: Claudie Revering M.D.   On: 05/23/2020 13:37   CT Angio Chest PE W and/or Wo Contrast  Result Date: 05/23/2020 CLINICAL DATA:  70 year old female with chest pain and shortness of breath. EXAM: CT ANGIOGRAPHY CHEST WITH CONTRAST TECHNIQUE: Multidetector CT imaging of the chest was performed using the standard protocol during bolus administration of intravenous contrast. Multiplanar CT image reconstructions and MIPs were obtained to evaluate the vascular anatomy. CONTRAST:  70m OMNIPAQUE IOHEXOL 350 MG/ML SOLN COMPARISON:  Chest CT dated 01/15/2020. FINDINGS: Cardiovascular: There is mild cardiomegaly. No pericardial effusion. Mild atherosclerotic calcification of the thoracic aorta. No pulmonary artery embolus identified. Mediastinum/Nodes: No definite hilar adenopathy. Multiple surgical clips noted in the mediastinum. The esophagus is grossly unremarkable. There is retained ingested matter versus mucous secretion within the esophagus. No mediastinal fluid collection. Lungs/Pleura: Bilateral lower lobe predominant pulmonary opacities, left greater right most consistent with pneumonia or aspiration. Mucus secretion or aspirated content noted in the right lower lobe bronchi. No pleural effusion or pneumothorax. Upper Abdomen: Small hepatic hypodense lesions are not characterized. Musculoskeletal: Osteopenia with  degenerative changes of the spine. Lower sternal fixation wire. Mild old appearing T12 compression fracture. No acute osseous pathology. Review of the MIP images confirms the above findings. IMPRESSION: 1. No CT evidence of pulmonary artery embolus. 2. Bilateral lower lobe predominant pulmonary opacities, left greater right most consistent with pneumonia or aspiration. Mucus secretion or aspirated content noted in the right lower lobe bronchi. 3. Aortic Atherosclerosis (ICD10-I70.0). Electronically Signed  By: Anner Crete M.D.   On: 05/23/2020 20:25    Procedures .Critical Care Performed by: Lucrezia Starch, MD Authorized by: Lucrezia Starch, MD   Critical care provider statement:    Critical care time (minutes):  43   Critical care was necessary to treat or prevent imminent or life-threatening deterioration of the following conditions:  Respiratory failure and cardiac failure   Critical care was time spent personally by me on the following activities:  Discussions with consultants, evaluation of patient's response to treatment, examination of patient, ordering and performing treatments and interventions, ordering and review of laboratory studies, ordering and review of radiographic studies, pulse oximetry, re-evaluation of patient's condition, obtaining history from patient or surrogate and review of old charts     Medications Ordered in ED Medications  sodium chloride flush (NS) 0.9 % injection 3 mL (has no administration in time range)  iohexol (OMNIPAQUE) 350 MG/ML injection 100 mL (53 mLs Intravenous Contrast Given 05/23/20 2017)    ED Course  I have reviewed the triage vital signs and the nursing notes.  Pertinent labs & imaging results that were available during my care of the patient were reviewed by me and considered in my medical decision making (see chart for details).  Clinical Course as of 05/23/20 2055  Mon May 23, 2020  1754 [facility] [RD]  1756 Echo, CT r/o PE -  page back tomorrow to check in at [phone] - rec admit at [facility] [RD]    Clinical Course User Index [RD] Lucrezia Starch, MD   MDM Rules/Calculators/A&P                         70 year old lady with bilateral lung transplant in 2016 presents to ER with concern for shortness of breath in setting of recent COVID-19 infection.  On exam patient in no distress, mild hypoxia on room air requiring 2 L nasal cannula supplemental oxygen.  BMP, CBC grossly stable.  CXR with minimal residual bilateral pleural thickening or fluid.  Discussed with her lung transplant team at [facility], agree with repeat echo and CT to rule out PE.  Okay to admit to [facility].  Requests paged back tomorrow for update by hospitalist team.    Given recent Covid illness and new hypoxia, check CTA which was negative for pulmonary embolism but did have findings concerning for possible pneumonia. Prior to CT, discussed with Hollie Salk with nephrology - ok to use contrast.  Procalcitonin significantly elevated, discussed antibiotic selection with pharmacy who recommended cefepime and sending MRSA screen.  BNP also elevated, believe patient would benefit from repeat echocardiogram to evaluate for possible heart failure.  Consult to hospitalist service for further management.  Final Clinical Impression(s) / ED Diagnoses Final diagnoses:  Community acquired pneumonia, unspecified laterality  Acute respiratory failure with hypoxia [facility])    Rx / DC Orders ED Discharge Orders    None       Lucrezia Starch, MD 05/23/20 [phone]

## 2020-05-23 NOTE — ED Triage Notes (Signed)
Patient presents to the ED by EMS with c/o shortness of breath after dialysis today. Initial sat 85% on room air initially. Now 91 on room air. Reports having covid 2 weeks ago. No distress at triage.

## 2020-05-23 NOTE — Progress Notes (Addendum)
Pharmacy Antibiotic Note  Meagan Dawson is a 70 y.o. female admitted on 05/23/2020 with SOB. Pharmacy has been consulted for cefepime dosing for PNA. Patient is s/p bilateral lung transplant in 2016. CT negative for PE and found bilateral lower lobe opacities left greater and right - most consistent with PNA. Patient is ESRD on HD MWF per Care Everywhere. Patient reported rash with amoxicillin - discussed with Dr. Roslynn Amble and given low likelihood of cross reactivity will proceed with cefepime.  Plan: Start cefepime 1g IV q24h  Follow up MRSA PCR for need to add vancomycin   Height: '4\' 11"'$  (149.9 cm) Weight: 34 kg (75 lb) IBW/kg (Calculated) : 43.2  Temp (24hrs), Avg:98.4 F (36.9 C), Min:98.1 F (36.7 C), Max:98.7 F (37.1 C)  Recent Labs  Lab 05/23/20 1317 05/23/20 1701  WBC 6.9  --   CREATININE 3.24*  --   LATICACIDVEN  --  1.4    Estimated Creatinine Clearance: 8.5 mL/min (A) (by C-G formula based on SCr of 3.24 mg/dL (H)).    Allergies  Allergen Reactions  . Amoxicillin Rash    Arms and neck were red and swollen     Antimicrobials this admission: Cefepime 1/24 >>   Dose adjustments this admission: N/a  Microbiology results: 1/24 MRSA PCR:  Thank you for allowing pharmacy to be a part of this patient's care.  Cristela Felt, PharmD Clinical Pharmacist  05/23/2020 8:42 PM

## 2020-05-23 NOTE — H&P (Signed)
History and Physical    Meagan Dawson [id number] DOB: 1950-09-24 DOA: 05/23/2020  PCP: Orpah Melter, MD Patient coming from: Home  Chief Complaint: Shortness of breath  HPI: Meagan Dawson is a 70 y.o. female with medical history significant of severe COPD status post bilateral lung transplant in 2016 at [facility] currently followed by transplant team at [facility], ESRD on HD MWF, diet-controlled type II diabetes, hypertension, hyperlipidemia presenting to the ED via EMS complaining of shortness of breath after dialysis today.  Patient states she has been feeling ill for the past 2 weeks.  Initially she had fevers, cough, and fatigue.  Fatigue and cough has persisted since then and she has developed shortness of breath lately.  Her shortness of breath became much worse after dialysis today.  States she had a positive home Covid test on 05/11/2020 and spoke to her PCP over the phone.  States she received monoclonal antibody infusion the following day but did not have any chest imaging done at that time.  States she did not receive any other treatments for her Covid infection as her oxygen saturation was okay.  No other complaints.  Denies chest pain, nausea, vomiting, or abdominal pain.  Reports having chronic diarrhea since her lung transplant several years ago.  ED Course: Afebrile.  Not tachycardic.  Blood pressure 92/44 on arrival, likely false reading as subsequent readings improved without patient receiving any IV fluids.  Tachypneic with respiratory rate in the 20s.  SPO2 85% on room air, improved to the 90s with 2 L supplemental oxygen.  WBC 6.9, hemoglobin 10.0, MCV 107.6, platelet count 261K.  Sodium 138, potassium 2.7, chloride 96, bicarb 26, BUN 11, creatinine 3.2, glucose 187.  Lactic acid 1.4.  BNP 1044.  High-sensitivity troponin 27 >25.  EKG without acute ischemic changes.  Procalcitonin 20.93.  D-dimer 1.33, LDH 240, ferritin 2012, fibrinogen 489, CRP 4.6, triglycerides 422.  SARS-CoV-2 PCR test  positive.  Respiratory viral panel negative.  Blood culture x2 pending.  Chest x-ray showing significantly improved bilateral pneumonia/edema with minimal residual bilateral pleural thickening or fluid.  Stable cardiomegaly and changes of COPD.  ED provider spoke to lung transplant team MD at [facility] who recommended obtaining an echocardiogram and CTA to rule out PE.  Recommended admission to [facility].  Requested hospitalist to page them tomorrow to to give them an update [phone]).  ED provider spoke to Dr. Hollie Salk from nephrology who was okay with giving the patient contrast.  CT angiogram chest negative for PE.  Showing bilateral lower lobe predominant pulmonary opacities, left greater than right most consistent with pneumonia or aspiration.  Mucus secretions are aspirated content noted in the right lower lobe bronchi.  Aortic atherosclerosis.  Patient was given cefepime.  MRSA PCR screen pending.  Review of Systems:  All systems reviewed and apart from history of presenting illness, are negative.  Past Medical History:  Diagnosis Date  . COPD (chronic obstructive pulmonary disease) ([place])   . Diabetes mellitus without complication ([place])   . Eczema   . Hyperlipidemia   . Hypertension     Past Surgical History:  Procedure Laterality Date  . LUNG TRANSPLANT, DOUBLE       reports that she quit smoking about 20 years ago. Her smoking use included cigarettes. She started smoking about 58 years ago. She smoked 1.50 packs per day. She has never used smokeless tobacco. She reports that she does not drink alcohol and does not use drugs.  Allergies  Allergen Reactions  .  Amoxicillin Rash    Arms and neck were red and swollen     Family History  Problem Relation Age of Onset  . Breast cancer Neg Hx     Prior to Admission medications   Medication Sig Start Date End Date Taking? Authorizing Provider  acetaminophen (TYLENOL) 325 MG tablet Take 650 mg by mouth every 6 (six) hours as  needed.    [provider]  amLODipine (NORVASC) 10 MG tablet Take 10 mg by mouth daily. Reported on 08/02/2015    [provider]  bacitracin 500 UNIT/GM ointment Apply 1 application topically 2 (two) times daily.    [provider]  doxycycline (VIBRAMYCIN) 100 MG capsule Take 1 capsule (100 mg total) by mouth 2 (two) times daily. One po bid x 7 days 05/12/19   Palumbo, April, MD  HYDROcodone-acetaminophen (NORCO) 5-325 MG tablet Take 1 tablet by mouth every 6 (six) hours as needed. 06/15/16   Orlie Dakin, MD  lansoprazole (PREVACID SOLUTAB) 30 MG disintegrating tablet Take 30 mg by mouth daily.    [provider]  loperamide (IMODIUM A-D) 2 MG tablet Take 2 mg by mouth 4 (four) times daily as needed for diarrhea or loose stools.    [provider]  magnesium oxide (MAG-OX) 400 MG tablet Take 400 mg by mouth 3 (three) times daily.    [provider]  Melatonin 3 MG CAPS Take 3 mg by mouth at bedtime as needed.    [provider]  metoprolol tartrate (LOPRESSOR) 25 MG tablet Take 12.5 mg by mouth 2 (two) times daily.    [provider]  mupirocin cream (BACTROBAN) 2 % Apply 1 application topically 2 (two) times daily. 05/12/19   Palumbo, April, MD  mycophenolate (CELLCEPT) 500 MG tablet Take 500 mg by mouth 2 (two) times daily.    [provider]  predniSONE (DELTASONE) 5 MG tablet Take 5 mg by mouth daily with breakfast. 2 tablets by mouth daily    [provider]  sodium bicarbonate 650 MG tablet Take 650 mg by mouth 3 (three) times daily.    [provider]  sulfamethoxazole-trimethoprim (BACTRIM,SEPTRA) 400-80 MG tablet Take 1 tablet by mouth 2 (two) times a week. On Monday and Thursday    [provider]  tacrolimus (PROGRAF) 1 MG capsule Take 7 mg by mouth every 12 (twelve) hours.    [provider]  valACYclovir (VALTREX) 500 MG tablet Take 2 tablets (1,000 mg total) by mouth  daily. 06/15/16   Orlie Dakin, MD  valGANciclovir (VALCYTE) 450 MG tablet Take 450 mg by mouth daily.    [provider]    Physical Exam: Vitals:   05/23/20 1815 05/23/20 1908 05/23/20 2048 05/23/20 2231  BP: (!) 104/56 108/62 (!) 120/54 (!) 117/96  Pulse: 66 71 68 68  Resp: (!) 26 (!) 29 (!) 27 (!) 24  Temp:  98.3 F (36.8 C) 98.4 F (36.9 C) 97.9 F (36.6 C)  TempSrc:  Oral Oral Oral  SpO2: 100% 98% 98% 100%  Weight:      Height:        Physical Exam Constitutional:      General: She is not in acute distress.    Appearance: She is ill-appearing.  HENT:     Head: Normocephalic and atraumatic.  Eyes:     Extraocular Movements: Extraocular movements intact.     Conjunctiva/sclera: Conjunctivae normal.  Cardiovascular:     Rate and Rhythm: Normal rate and regular rhythm.  Pulses: Normal pulses.  Pulmonary:     Effort: Pulmonary effort is normal. No respiratory distress.     Breath sounds: No wheezing or rales.     Comments: Satting in the upper 90s on 2 L supplemental oxygen Abdominal:     General: Bowel sounds are normal. There is no distension.     Palpations: Abdomen is soft.     Tenderness: There is no abdominal tenderness.  Musculoskeletal:        General: No swelling or tenderness.     Cervical back: Normal range of motion and neck supple.  Skin:    General: Skin is warm and dry.  Neurological:     General: No focal deficit present.     Mental Status: She is alert and oriented to person, place, and time.     Labs on Admission: I have personally reviewed following labs and imaging studies  CBC: Recent Labs  Lab 05/23/20 1317  WBC 6.9  NEUTROABS 5.9  HGB 10.0*  HCT 31.0*  MCV 107.6*  PLT 0000000   Basic Metabolic Panel: Recent Labs  Lab 05/23/20 1317  NA 138  K 2.7*  CL 96*  CO2 26  GLUCOSE 187*  BUN 11  CREATININE 3.24*  CALCIUM 8.1*   GFR: Estimated Creatinine Clearance: 8.5 mL/min (A) (by C-G formula based on SCr of 3.24  mg/dL (H)). Liver Function Tests: Recent Labs  Lab 05/23/20 1317  AST 20  ALT 12  ALKPHOS 49  BILITOT 0.9  PROT 5.6*  ALBUMIN 2.8*   No results for input(s): LIPASE, AMYLASE in the last 168 hours. No results for input(s): AMMONIA in the last 168 hours. Coagulation Profile: No results for input(s): INR, PROTIME in the last 168 hours. Cardiac Enzymes: No results for input(s): CKTOTAL, CKMB, CKMBINDEX, TROPONINI in the last 168 hours. BNP (last 3 results) No results for input(s): PROBNP in the last 8760 hours. HbA1C: No results for input(s): HGBA1C in the last 72 hours. CBG: No results for input(s): GLUCAP in the last 168 hours. Lipid Profile: Recent Labs    05/23/20 1930  TRIG 422*   Thyroid Function Tests: No results for input(s): TSH, T4TOTAL, FREET4, T3FREE, THYROIDAB in the last 72 hours. Anemia Panel: Recent Labs    05/23/20 1701  FERRITIN 2,012*   Urine analysis:    Component Value Date/Time   COLORURINE YELLOW 06/15/2016 1353   APPEARANCEUR CLOUDY (A) 06/15/2016 1353   LABSPEC 1.019 06/15/2016 1353   PHURINE 5.5 06/15/2016 1353   GLUCOSEU NEGATIVE 06/15/2016 1353   HGBUR SMALL (A) 06/15/2016 1353   BILIRUBINUR SMALL (A) 06/15/2016 1353   KETONESUR NEGATIVE 06/15/2016 1353   PROTEINUR 100 (A) 06/15/2016 1353   NITRITE NEGATIVE 06/15/2016 1353   LEUKOCYTESUR NEGATIVE 06/15/2016 1353    Radiological Exams on Admission: DG Chest 2 View  Result Date: 05/23/2020 CLINICAL DATA:  Shortness of breath over the past 2 weeks. EXAM: CHEST - 2 VIEW COMPARISON:  01/18/2020 FINDINGS: Stable enlarged cardiac silhouette and postsurgical changes. The lungs are hyperexpanded with mild prominence of the interstitial markings. Significantly improved bilateral airspace opacity. Minimal bilateral pleural thickening or fluid with significant improvement. IMPRESSION: 1. Significantly improved bilateral pneumonia/edema with minimal residual bilateral pleural thickening or fluid. 2.  Stable cardiomegaly and changes of COPD. Electronically Signed   By: Claudie Revering M.D.   On: 05/23/2020 13:37   CT Angio Chest PE W and/or Wo Contrast  Result Date: 05/23/2020 CLINICAL DATA:  70 year old female with chest pain and shortness  of breath. EXAM: CT ANGIOGRAPHY CHEST WITH CONTRAST TECHNIQUE: Multidetector CT imaging of the chest was performed using the standard protocol during bolus administration of intravenous contrast. Multiplanar CT image reconstructions and MIPs were obtained to evaluate the vascular anatomy. CONTRAST:  59m OMNIPAQUE IOHEXOL 350 MG/ML SOLN COMPARISON:  Chest CT dated 01/15/2020. FINDINGS: Cardiovascular: There is mild cardiomegaly. No pericardial effusion. Mild atherosclerotic calcification of the thoracic aorta. No pulmonary artery embolus identified. Mediastinum/Nodes: No definite hilar adenopathy. Multiple surgical clips noted in the mediastinum. The esophagus is grossly unremarkable. There is retained ingested matter versus mucous secretion within the esophagus. No mediastinal fluid collection. Lungs/Pleura: Bilateral lower lobe predominant pulmonary opacities, left greater right most consistent with pneumonia or aspiration. Mucus secretion or aspirated content noted in the right lower lobe bronchi. No pleural effusion or pneumothorax. Upper Abdomen: Small hepatic hypodense lesions are not characterized. Musculoskeletal: Osteopenia with degenerative changes of the spine. Lower sternal fixation wire. Mild old appearing T12 compression fracture. No acute osseous pathology. Review of the MIP images confirms the above findings. IMPRESSION: 1. No CT evidence of pulmonary artery embolus. 2. Bilateral lower lobe predominant pulmonary opacities, left greater right most consistent with pneumonia or aspiration. Mucus secretion or aspirated content noted in the right lower lobe bronchi. 3. Aortic Atherosclerosis (ICD10-I70.0). Electronically Signed   By: AAnner CreteM.D.   On:  05/23/2020 20:25    EKG: Independently reviewed.  Sinus rhythm with occasional PVCs and PACs.  QTC 511.  No acute ischemic changes.  Assessment/Plan Principal Problem:   Pneumonia Active Problems:   COVID-19 virus infection   [place]   ESRD (end stage renal disease) ([place]   Anemia   Acute hypoxemic respiratory failure secondary to COVID-19 viral infection and multifocal pneumonia: Patient reports testing positive for Covid with a home test on 05/11/2020.  She received sotrovimab infusion on 05/12/2020.  States she did not receive any other treatments for Covid at that time because she was not dyspneic.  She has continued to have a cough and has developed dyspnea lately.  SARS-CoV-2 PCR test done in the ED today positive.  No other virus infections identified on respiratory viral panel.  CT angiogram chest negative for PE.  Showing bilateral lower lobe predominant pulmonary opacities, left greater than right most consistent with pneumonia or aspiration.  No leukocytosis on labs but procalcitonin significantly elevated at 20.93.  Inflammatory markers elevated: D-dimer 1.33, LDH 240, ferritin 2012, fibrinogen 489, CRP 4.6, triglycerides 422.  SPO2 85% on room air.  Currently satting in the upper 90s on 2 L supplemental oxygen with no signs of respiratory distress.  No tachycardia, hypotension, or lactic acidosis to suggest sepsis. -Remdesivir -IV Decadron 6 mg daily -Continue cefepime.  Flagyl added on for anaerobic coverage given concern for possible aspiration.  MRSA PCR pending, if positive, start vancomycin as well. -BNP elevated at 1044.  She underwent dialysis today prior to coming into the ED.  Does not appear volume overloaded on exam.  Echocardiogram has been ordered. -Vitamin C, zinc, vitamin D -Antitussive as needed -Tylenol as needed -Bronchodilator as needed -Daily CBC with differential, CMP, CRP, D-dimer, procalcitonin -Airborne and contact  precautions -Incentive spirometry, flutter valve -Encourage prone positioning -Continuous pulse ox -Supplemental oxygen as needed to keep oxygen saturation above 92% -Blood culture x2 pending -Keep n.p.o., SLP eval, aspiration precautions -Bilateral lower extremity Dopplers ordered to rule out DVT given elevated D-dimer  History of bilateral lung transplant -Resume home mycophenolate and tacrolimus after  pharmacy med rec is done.  Steroid as mentioned above.  [facility] transplant team requested update tomorrow [phone]).  ESRD on HD: Last dialysis session was today.  Does not appear volume overloaded on exam.  No hyperkalemia or metabolic acidosis on labs. -Please consult nephrology in the morning for dialysis.  Chronic macrocytic anemia: Hemoglobin at baseline. -Continue to monitor  Hypokalemia: Potassium 2.7. -Replete potassium judiciously as she is a dialysis patient.  Check magnesium level and replete if low.  Continue to monitor electrolytes.  Mild troponin elevation: Likely due to demand ischemia in the setting of COVID-19 infection and pneumonia.  High-sensitivity troponin mildly elevated but stable (27> 25).  EKG without acute ischemic changes.  Patient is not endorsing chest pain. -Cardiac monitoring  Diet-controlled type 2 diabetes -Check A1c.  Very sensitive sliding scale insulin ordered as she is at risk for steroid-induced hyperglycemia.  Physical deconditioning -PT/OT  QT prolongation on EKG -Cardiac monitoring.  Monitor potassium and magnesium levels.  Avoid QT prolonging drugs if possible and repeat EKG in the morning.  Pharmacy med rec pending.  DVT prophylaxis: Subcutaneous heparin Code Status: Patient wishes to be full code. Family Communication: No family available at this time. Disposition Plan: Status is: Inpatient  Remains inpatient appropriate because:IV treatments appropriate due to intensity of illness or inability to take PO, Inpatient level of care  appropriate due to severity of illness and Acute hypoxemic respiratory failure   Dispo: The patient is from: Home              Anticipated d/c is to: Home              Anticipated d/c date is: 3 days              Patient currently is not medically stable to d/c.   Difficult to place patient No  The medical decision making on this patient was of high complexity and the patient is at high risk for clinical deterioration, therefore this is a level 3 visit.  Meagan Leff MD [facility]  If 7PM-7AM, please contact night-coverage [email or web address]  05/23/2020, 11:37 PM

## 2020-05-23 NOTE — ED Notes (Signed)
Patient transported to CT 

## 2020-05-24 ENCOUNTER — Inpatient Hospital Stay (HOSPITAL_COMMUNITY): Payer: Medicare Other

## 2020-05-24 DIAGNOSIS — R7989 Other specified abnormal findings of blood chemistry: Secondary | ICD-10-CM

## 2020-05-24 DIAGNOSIS — J189 Pneumonia, unspecified organism: Secondary | ICD-10-CM

## 2020-05-24 DIAGNOSIS — Z942 Lung transplant status: Secondary | ICD-10-CM | POA: Diagnosis not present

## 2020-05-24 DIAGNOSIS — U071 COVID-19: Secondary | ICD-10-CM | POA: Diagnosis not present

## 2020-05-24 DIAGNOSIS — N186 End stage renal disease: Secondary | ICD-10-CM

## 2020-05-24 DIAGNOSIS — Z992 Dependence on renal dialysis: Secondary | ICD-10-CM

## 2020-05-24 DIAGNOSIS — J9601 Acute respiratory failure with hypoxia: Secondary | ICD-10-CM

## 2020-05-24 DIAGNOSIS — D631 Anemia in chronic kidney disease: Secondary | ICD-10-CM

## 2020-05-24 LAB — CBC WITH DIFFERENTIAL/PLATELET
Abs Immature Granulocytes: 0 10*3/uL (ref 0.00–0.07)
Basophils Absolute: 0 10*3/uL (ref 0.0–0.1)
Basophils Relative: 0 %
Eosinophils Absolute: 0 10*3/uL (ref 0.0–0.5)
Eosinophils Relative: 0 %
HCT: 29.3 % — ABNORMAL LOW (ref 36.0–46.0)
Hemoglobin: 8.8 g/dL — ABNORMAL LOW (ref 12.0–15.0)
Lymphocytes Relative: 2 %
Lymphs Abs: 0.1 10*3/uL — ABNORMAL LOW (ref 0.7–4.0)
MCH: 33.6 pg (ref 26.0–34.0)
MCHC: 30 g/dL (ref 30.0–36.0)
MCV: 111.8 fL — ABNORMAL HIGH (ref 80.0–100.0)
Monocytes Absolute: 0.1 10*3/uL (ref 0.1–1.0)
Monocytes Relative: 2 %
Neutro Abs: 4.6 10*3/uL (ref 1.7–7.7)
Neutrophils Relative %: 96 %
Platelets: 242 10*3/uL (ref 150–400)
RBC: 2.62 MIL/uL — ABNORMAL LOW (ref 3.87–5.11)
RDW: 15.9 % — ABNORMAL HIGH (ref 11.5–15.5)
WBC: 4.8 10*3/uL (ref 4.0–10.5)
nRBC: 0.4 % — ABNORMAL HIGH (ref 0.0–0.2)
nRBC: 1 /100 WBC — ABNORMAL HIGH

## 2020-05-24 LAB — COMPREHENSIVE METABOLIC PANEL
ALT: 10 U/L (ref 0–44)
AST: 19 U/L (ref 15–41)
Albumin: 2.5 g/dL — ABNORMAL LOW (ref 3.5–5.0)
Alkaline Phosphatase: 46 U/L (ref 38–126)
Anion gap: 15 (ref 5–15)
BUN: 22 mg/dL (ref 8–23)
CO2: 23 mmol/L (ref 22–32)
Calcium: 7.4 mg/dL — ABNORMAL LOW (ref 8.9–10.3)
Chloride: 100 mmol/L (ref 98–111)
Creatinine, Ser: 5.03 mg/dL — ABNORMAL HIGH (ref 0.44–1.00)
GFR, Estimated: 9 mL/min — ABNORMAL LOW (ref >60)
Glucose, Bld: 156 mg/dL — ABNORMAL HIGH (ref 70–99)
Potassium: 3.6 mmol/L (ref 3.5–5.1)
Sodium: 138 mmol/L (ref 135–145)
Total Bilirubin: 0.9 mg/dL (ref 0.3–1.2)
Total Protein: 5 g/dL — ABNORMAL LOW (ref 6.5–8.1)

## 2020-05-24 LAB — CBG MONITORING, ED
Glucose-Capillary: 164 mg/dL — ABNORMAL HIGH (ref 70–99)
Glucose-Capillary: 245 mg/dL — ABNORMAL HIGH (ref 70–99)
Glucose-Capillary: 317 mg/dL — ABNORMAL HIGH (ref 70–99)

## 2020-05-24 LAB — PROCALCITONIN: Procalcitonin: 17.64 ng/mL

## 2020-05-24 LAB — HEMOGLOBIN AND HEMATOCRIT, BLOOD
HCT: 28.1 % — ABNORMAL LOW (ref 36.0–46.0)
Hemoglobin: 8.6 g/dL — ABNORMAL LOW (ref 12.0–15.0)

## 2020-05-24 LAB — HEMOGLOBIN A1C
Hgb A1c MFr Bld: 6.7 % — ABNORMAL HIGH (ref 4.8–5.6)
Mean Plasma Glucose: 145.59 mg/dL

## 2020-05-24 LAB — C-REACTIVE PROTEIN: CRP: 7.3 mg/dL — ABNORMAL HIGH (ref <1.0)

## 2020-05-24 LAB — D-DIMER, QUANTITATIVE: D-Dimer, Quant: 1.29 ug/mL-FEU — ABNORMAL HIGH (ref 0.00–0.50)

## 2020-05-24 LAB — PHOSPHORUS: Phosphorus: 3.8 mg/dL (ref 2.5–4.6)

## 2020-05-24 LAB — LACTIC ACID, PLASMA: Lactic Acid, Venous: 1 mmol/L (ref 0.5–1.9)

## 2020-05-24 LAB — MRSA PCR SCREENING: MRSA by PCR: NEGATIVE

## 2020-05-24 LAB — MAGNESIUM: Magnesium: 1.6 mg/dL — ABNORMAL LOW (ref 1.7–2.4)

## 2020-05-24 MED ORDER — PANTOPRAZOLE SODIUM 40 MG IV SOLR
40.0000 mg | Freq: Two times a day (BID) | INTRAVENOUS | Status: DC
  Administered 2020-05-24 – 2020-05-26 (×4): 40 mg via INTRAVENOUS
  Filled 2020-05-24 (×4): qty 40

## 2020-05-24 MED ORDER — MAGNESIUM SULFATE 2 GM/50ML IV SOLN
2.0000 g | Freq: Once | INTRAVENOUS | Status: AC
  Administered 2020-05-24: 2 g via INTRAVENOUS
  Filled 2020-05-24: qty 50

## 2020-05-24 MED ORDER — TACROLIMUS 1 MG PO CAPS
1.0000 mg | ORAL_CAPSULE | Freq: Two times a day (BID) | ORAL | Status: DC
  Administered 2020-05-24 – 2020-05-26 (×5): 1 mg via ORAL
  Filled 2020-05-24 (×5): qty 1

## 2020-05-24 MED ORDER — POTASSIUM CHLORIDE CRYS ER 20 MEQ PO TBCR
40.0000 meq | EXTENDED_RELEASE_TABLET | Freq: Once | ORAL | Status: AC
  Administered 2020-05-24: 40 meq via ORAL
  Filled 2020-05-24: qty 2

## 2020-05-24 MED ORDER — ATORVASTATIN CALCIUM 10 MG PO TABS
10.0000 mg | ORAL_TABLET | Freq: Every day | ORAL | Status: DC
  Administered 2020-05-24 – 2020-05-26 (×3): 10 mg via ORAL
  Filled 2020-05-24 (×3): qty 1

## 2020-05-24 MED ORDER — MIDODRINE HCL 5 MG PO TABS
10.0000 mg | ORAL_TABLET | ORAL | Status: DC
  Administered 2020-05-25: 10 mg via ORAL
  Filled 2020-05-24: qty 2

## 2020-05-24 MED ORDER — AZITHROMYCIN 500 MG PO TABS
250.0000 mg | ORAL_TABLET | ORAL | Status: DC
Start: 2020-05-25 — End: 2020-05-26
  Administered 2020-05-25: 250 mg via ORAL
  Filled 2020-05-24: qty 1

## 2020-05-24 MED ORDER — ACYCLOVIR 400 MG PO TABS
400.0000 mg | ORAL_TABLET | Freq: Two times a day (BID) | ORAL | Status: DC
  Administered 2020-05-24 – 2020-05-26 (×5): 400 mg via ORAL
  Filled 2020-05-24 (×6): qty 1

## 2020-05-24 MED ORDER — METHYLPREDNISOLONE SODIUM SUCC 40 MG IJ SOLR
40.0000 mg | Freq: Two times a day (BID) | INTRAMUSCULAR | Status: DC
  Administered 2020-05-24 – 2020-05-25 (×3): 40 mg via INTRAVENOUS
  Filled 2020-05-24 (×3): qty 1

## 2020-05-24 MED ORDER — PANTOPRAZOLE SODIUM 40 MG IV SOLR: 40.0000 mg | Freq: Once | INTRAVENOUS | Status: DC

## 2020-05-24 MED ORDER — CHLORHEXIDINE GLUCONATE CLOTH 2 % EX PADS
6.0000 | MEDICATED_PAD | Freq: Every day | CUTANEOUS | Status: DC
  Administered 2020-05-25 – 2020-05-26 (×2): 6 via TOPICAL

## 2020-05-24 MED ORDER — MYCOPHENOLATE MOFETIL 250 MG PO CAPS
250.0000 mg | ORAL_CAPSULE | Freq: Two times a day (BID) | ORAL | Status: DC
  Administered 2020-05-24 – 2020-05-26 (×5): 250 mg via ORAL
  Filled 2020-05-24 (×6): qty 1

## 2020-05-24 MED ORDER — SULFAMETHOXAZOLE-TRIMETHOPRIM 400-80 MG PO TABS
1.0000 | ORAL_TABLET | ORAL | Status: DC
  Administered 2020-05-26: 1 via ORAL
  Filled 2020-05-24: qty 1

## 2020-05-24 MED ORDER — ASPIRIN EC 81 MG PO TBEC
81.0000 mg | DELAYED_RELEASE_TABLET | Freq: Every day | ORAL | Status: DC
Start: 2020-05-24 — End: 2020-05-26
  Administered 2020-05-24 – 2020-05-26 (×3): 81 mg via ORAL
  Filled 2020-05-24 (×3): qty 1

## 2020-05-24 MED ORDER — GUAIFENESIN ER 600 MG PO TB12
600.0000 mg | ORAL_TABLET | Freq: Two times a day (BID) | ORAL | Status: DC
  Administered 2020-05-24 – 2020-05-26 (×5): 600 mg via ORAL
  Filled 2020-05-24 (×5): qty 1

## 2020-05-24 NOTE — ED Notes (Signed)
Pt has blood in stool. Pt stated this has happened in the past and her primary doctor informed her to go get a colonoscopy. Pt stated she has not gone due to fear from her past experience. 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 Md paged.

## 2020-05-24 NOTE — ED Notes (Signed)
Attempted to call report was informed that the ac,johhn told the floor to hold that room. Attempted to call ac to clarify and was unable to reach him.

## 2020-05-24 NOTE — ED Notes (Signed)
Pt up to bedside commode

## 2020-05-24 NOTE — ED Notes (Signed)
Checked patient cbg it was 70 notified RN of blood sugar patient is resting with call bell in reach

## 2020-05-24 NOTE — ED Notes (Signed)
Vascular US at bedside.

## 2020-05-24 NOTE — ED Notes (Signed)
Pt states "I feel better since I got some rest"

## 2020-05-24 NOTE — Consult Note (Signed)
Name: Meagan Dawson MRN: [id number] DOB: May 17, 1950    ADMISSION DATE:  05/23/2020 CONSULTATION DATE: 25 2022  REFERRING MD : [facility]  CHIEF COMPLAINT: Shortness of breath and weakness  BRIEF PATIENT DESCRIPTION:  70 year old female awake alert no acute distress at rest  SIGNIFICANT EVENTS    STUDIES:  05/24/2020 CT percussible bilateral opacities consistent with   HISTORY OF PRESENT ILLNESS:   70 year old female with a past medical history significant for severe COPD that required bilateral lung transplant 2016 outside hospital that is being monitored by [facility]. She notes been in her usual state of health until approximately 3 days prior to admission. She noted increasing shortness of breath and just feeling lack of energy. She did not endorse copious secretions. She is currently in the emergency department at [facility] on room air with O2 saturations of 96%. Pulmonary critical care has been called to evaluate as she cannot be transferred to [facility] at this time. She probably does have a bacterial pneumonia by x-ray. She is Covid positive but this may be an incidental finding at this time. We will continue antimicrobial therapy O2 supplementation if needed pulmonary toilet. Little for pulmonary to offer at this time. Further input from [facility] in the future might be helpful. CT scan demonstrates some changes consistent with pneumonia. End-stage renal disease.   PAST MEDICAL HISTORY :   has a past medical history of COPD (chronic obstructive pulmonary disease) ([place]), Diabetes mellitus without complication ([place]), Eczema, Hyperlipidemia, and Hypertension.  has a past surgical history that includes Lung transplant, double. Prior to Admission medications   Medication Sig Start Date End Date Taking? Authorizing Provider  acetaminophen (TYLENOL) 325 MG tablet Take 650 mg by mouth every 6 (six) hours as needed for  moderate pain or headache.   Yes [provider]  acyclovir (ZOVIRAX) 400 MG tablet Take 400 mg by mouth 2 (two) times daily. 03/19/20  Yes [provider]  aspirin 81 MG EC tablet Take 81 mg by mouth daily. 12/04/18  Yes [provider]  atorvastatin (LIPITOR) 10 MG tablet Take 10 mg by mouth daily. 05/08/20  Yes [provider]  azithromycin (ZITHROMAX) 250 MG tablet Take 250 mg by mouth 3 (three) times a week. 05/13/20  Yes [provider]  calcium acetate (PHOSLO) 667 MG capsule Take 1,334 mg by mouth 3 (three) times daily. 04/12/20  Yes [provider]  Calcium Carbonate (CALCIUM 600 PO) Take 600 mg by mouth daily.   Yes [provider]  HYDROcodone-acetaminophen (NORCO) 5-325 MG tablet Take 1 tablet by mouth every 6 (six) hours as needed. Patient taking differently: Take 1 tablet by mouth every 6 (six) hours as needed for moderate pain. 06/15/16  Yes Orlie Dakin, MD  loperamide (IMODIUM A-D) 2 MG tablet Take 2 mg by mouth 4 (four) times daily as needed for diarrhea or loose stools.   Yes [provider]  Melatonin 3 MG CAPS Take 3 mg by mouth at bedtime as needed (sleep).   Yes [provider]  metoprolol tartrate (LOPRESSOR) 25 MG tablet Take 25 mg by mouth 2 (two) times daily.   Yes [provider]  midodrine (PROAMATINE) 10 MG tablet Take 10 mg by mouth every Monday, Wednesday, and Friday. 04/29/20  Yes [provider]  mycophenolate (CELLCEPT) 250 MG capsule Take 250 mg by mouth 2 (two) times daily.   Yes [provider]  polycarbophil (FIBERCON) 625 MG  tablet Take 625 mg by mouth daily. 12/19/15  Yes [provider]  predniSONE (DELTASONE) 5 MG tablet Take 5 mg by mouth daily with breakfast.   Yes [provider]  sodium bicarbonate 650 MG tablet Take 325 mg by mouth 2 (two) times daily.   Yes [provider]  sulfamethoxazole-trimethoprim (BACTRIM,SEPTRA)  400-80 MG tablet Take 1 tablet by mouth 2 (two) times a week. On Monday and Thursday   Yes [provider]  tacrolimus (PROGRAF) 1 MG capsule Take 1 mg by mouth every 12 (twelve) hours.   Yes [provider]  Vitamin D, Cholecalciferol, 25 MCG (1000 UT) TABS Take 25 mcg by mouth daily.   Yes [provider]  doxycycline (VIBRAMYCIN) 100 MG capsule Take 1 capsule (100 mg total) by mouth 2 (two) times daily. One po bid x 7 days Patient not taking: Reported on 05/24/2020 05/12/19   Palumbo, April, MD  mupirocin cream (BACTROBAN) 2 % Apply 1 application topically 2 (two) times daily. Patient not taking: Reported on 05/24/2020 05/12/19   Randal Buba, April, MD   Allergies  Allergen Reactions  . Amoxicillin Rash    Arms and neck were red and swollen     FAMILY HISTORY:  family history is not on file. SOCIAL HISTORY:  reports that she quit smoking about 20 years ago. Her smoking use included cigarettes. She started smoking about 58 years ago. She smoked 1.50 packs per day. She has never used smokeless tobacco. She reports that she does not drink alcohol and does not use drugs.  REVIEW OF SYSTEMS:   10 point review of system taken, please see HPI for positives and negatives.    SUBJECTIVE:  70 year old female who does not appear acutely ill. VITAL SIGNS: Temp:  [97.9 F (36.6 C)-99.3 F (37.4 C)] 99.3 F (37.4 C) (01/25 0100) Pulse Rate:  [65-81] 71 (01/25 1000) Resp:  [18-29] 22 (01/25 1000) BP: (92-144)/(44-96) 98/50 (01/25 1000) SpO2:  [85 %-100 %] 92 % (01/25 1000) Weight:  [34 kg] 34 kg (01/24 1309)  PHYSICAL EXAMINATION: General: Frail elderly female who reports feeling better than she did yesterday Neuro: Grossly intact without focal defect HEENT: No JVD no lymphadenopathy is appreciated Cardiovascular: Regular regular rate rhythm Lungs: Decreased at bases questionable expiratory wheeze Abdomen: Soft nontender positive bowel Musculoskeletal:  Intact Skin: Warm and dry  Recent Labs  Lab 05/23/20 1317 05/24/20 0500  NA 138 138  K 2.7* 3.6  CL 96* 100  CO2 26 23  BUN 11 22  CREATININE 3.24* 5.03*  GLUCOSE 187* 156*   Recent Labs  Lab 05/23/20 1317 05/24/20 0500  HGB 10.0* 8.8*  HCT 31.0* 29.3*  WBC 6.9 4.8  PLT 261 242   DG Chest 2 View  Result Date: 05/23/2020 CLINICAL DATA:  Shortness of breath over the past 2 weeks. EXAM: CHEST - 2 VIEW COMPARISON:  01/18/2020 FINDINGS: Stable enlarged cardiac silhouette and postsurgical changes. The lungs are hyperexpanded with mild prominence of the interstitial markings. Significantly improved bilateral airspace opacity. Minimal bilateral pleural thickening or fluid with significant improvement. IMPRESSION: 1. Significantly improved bilateral pneumonia/edema with minimal residual bilateral pleural thickening or fluid. 2. Stable cardiomegaly and changes of COPD. Electronically Signed   By: Claudie Revering M.D.   On: 05/23/2020 13:37   CT Angio Chest PE W and/or Wo Contrast  Result Date: 05/23/2020 CLINICAL DATA:  70 year old female with chest pain and shortness of breath. EXAM: CT ANGIOGRAPHY CHEST WITH CONTRAST TECHNIQUE: Multidetector CT imaging of  the chest was performed using the standard protocol during bolus administration of intravenous contrast. Multiplanar CT image reconstructions and MIPs were obtained to evaluate the vascular anatomy. CONTRAST:  31m OMNIPAQUE IOHEXOL 350 MG/ML SOLN COMPARISON:  Chest CT dated 01/15/2020. FINDINGS: Cardiovascular: There is mild cardiomegaly. No pericardial effusion. Mild atherosclerotic calcification of the thoracic aorta. No pulmonary artery embolus identified. Mediastinum/Nodes: No definite hilar adenopathy. Multiple surgical clips noted in the mediastinum. The esophagus is grossly unremarkable. There is retained ingested matter versus mucous secretion within the esophagus. No mediastinal fluid collection. Lungs/Pleura: Bilateral lower lobe  predominant pulmonary opacities, left greater right most consistent with pneumonia or aspiration. Mucus secretion or aspirated content noted in the right lower lobe bronchi. No pleural effusion or pneumothorax. Upper Abdomen: Small hepatic hypodense lesions are not characterized. Musculoskeletal: Osteopenia with degenerative changes of the spine. Lower sternal fixation wire. Mild old appearing T12 compression fracture. No acute osseous pathology. Review of the MIP images confirms the above findings. IMPRESSION: 1. No CT evidence of pulmonary artery embolus. 2. Bilateral lower lobe predominant pulmonary opacities, left greater right most consistent with pneumonia or aspiration. Mucus secretion or aspirated content noted in the right lower lobe bronchi. 3. Aortic Atherosclerosis (ICD10-I70.0). Electronically Signed   By: AAnner CreteM.D.   On: 05/23/2020 20:25   VAS [place]OWER EXTREMITY VENOUS (DVT)  Result Date: 05/24/2020  Lower Venous DVT Study Indications: D-Dimer. Other Indications: Covid. Risk Factors: Surgery hx Lung transplant. Comparison Study: No previous exams Performing Technologist: LVonzell SchlatterRVT  Examination Guidelines: A complete evaluation includes B-mode imaging, spectral Doppler, color Doppler, and power Doppler as needed of all accessible portions of each vessel. Bilateral testing is considered an integral part of a complete examination. Limited examinations for reoccurring indications may be performed as noted. The reflux portion of the exam is performed with the patient in reverse Trendelenburg.  +---------+---------------+---------+-----------+----------+--------------+ RIGHT    CompressibilityPhasicitySpontaneityPropertiesThrombus Aging +---------+---------------+---------+-----------+----------+--------------+ CFV      Full           Yes      Yes                                 +---------+---------------+---------+-----------+----------+--------------+ SFJ      Full                                                         +---------+---------------+---------+-----------+----------+--------------+ FV Prox  Full                                                        +---------+---------------+---------+-----------+----------+--------------+ FV Mid   Full                                                        +---------+---------------+---------+-----------+----------+--------------+ FV DistalFull                                                        +---------+---------------+---------+-----------+----------+--------------+  PFV      Full                                                        +---------+---------------+---------+-----------+----------+--------------+ POP      Full           Yes      Yes                                 +---------+---------------+---------+-----------+----------+--------------+ PTV      Full                                                        +---------+---------------+---------+-----------+----------+--------------+ PERO     Full                                                        +---------+---------------+---------+-----------+----------+--------------+   +---------+---------------+---------+-----------+----------+--------------+ LEFT     CompressibilityPhasicitySpontaneityPropertiesThrombus Aging +---------+---------------+---------+-----------+----------+--------------+ CFV      Full           Yes      Yes                                 +---------+---------------+---------+-----------+----------+--------------+ SFJ      Full                                                        +---------+---------------+---------+-----------+----------+--------------+ FV Prox  Full                                                        +---------+---------------+---------+-----------+----------+--------------+ FV Mid   Full                                                         +---------+---------------+---------+-----------+----------+--------------+ FV DistalFull                                                        +---------+---------------+---------+-----------+----------+--------------+ PFV      Full                                                        +---------+---------------+---------+-----------+----------+--------------+  POP      Full           Yes      Yes                                 +---------+---------------+---------+-----------+----------+--------------+ PTV      Full                                                        +---------+---------------+---------+-----------+----------+--------------+ PERO     Full                                                        +---------+---------------+---------+-----------+----------+--------------+  Summary: BILATERAL: - No evidence of deep vein thrombosis seen in the lower extremities, bilaterally. -No evidence of popliteal cyst, bilaterally.   *See table(s) above for measurements and observations. Electronically signed by Deitra Mayo MD on 05/24/2020 at 11:26:15 AM.    Final     ASSESSMENT: Principal Problem:   Pneumonia Active Problems:   [id number] virus infection   History of lung transplant ([place])   ESRD (end stage renal disease) ([place])   Anemia  Discussion: 70 year old female with a past medical history significant for severe COPD that required bilateral lung transplant 2016 outside hospital that is being monitored by [facility]. She notes been in her usual state of health until approximately 3 days prior to admission. She noted increasing shortness of breath and just feeling lack of energy. She did not endorse copious secretions. She is currently in the emergency department at [facility] on room air with O2 saturations of 96%. Pulmonary critical care has been called to evaluate as she cannot be transferred to [facility] at this time. She probably does have a bacterial pneumonia by x-ray. She is Covid positive but this may be an incidental finding at this time. We will continue antimicrobial therapy O2 supplementation if needed pulmonary toilet. Little for pulmonary to offer at this time. Further input from [facility] in the future might be helpful. CT scan demonstrates some changes consistent with pneumonia. End-stage renal disease.   Covid positive Bilateral lower lobe pulmonary opacities questionable pneumonia End-stage renal disease    PLAN:  Agree with current treatments She is not O2 dependent at this time Pulmonary toilet She is on acyclovir, Zithromax, cefepime, Flagyl, Bactrim, Further treated with other antivirals with remdesivir  Continue high-dose steroid Continue hemodialysis per routine   Richardson Landry Bubber Rothert ACNP Acute Care Nurse Practitioner [place] Please consult [place] 05/24/2020, 12:25 PM

## 2020-05-24 NOTE — Progress Notes (Signed)
Pt was seen for mobility in her room and requires HHA with furniture walking to get around the bed.  Her plan is to request a rehab stay in SNF due to her need to have independent functional mobility at home.  Follow for goals of PT including her balance, quality of gait and decision about best AD for compensation of mobility deficits.  05/24/20 2000  PT Visit Information  Last PT Received On 05/24/20  Assistance Needed +1  History of Present Illness 70 yo female with onset of recent Covid+ test was noted to have fever, cough and fatigue, hypoxia.  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  Prior Function  Level of Independence Independent with assistive device(s)  Communication  Communication No difficulties  Pain Assessment  Pain Assessment Faces  Pain Score 0  Cognition  Arousal/Alertness Awake/alert  Behavior During Therapy WFL for tasks assessed/performed  Overall Cognitive Status Within Functional Limits for tasks assessed  Upper Extremity Assessment  Upper Extremity Assessment Overall WFL for tasks assessed  Lower Extremity Assessment  Lower Extremity Assessment Generalized weakness  Cervical / Trunk Assessment  Cervical / Trunk Assessment Normal  Bed Mobility  Overal bed mobility Needs Assistance  Bed Mobility Supine to Sit;Sit to Supine  Supine to sit Min assist  Sit to supine Min assist  Transfers  Overall transfer level Needs assistance  Equipment used 1 person hand held assist  Transfers Sit to/from Stand  Sit to Stand Min assist   Ambulation/Gait  Ambulation/Gait assistance Min assist  Gait Distance (Feet) 20 Feet  Assistive device 1 person hand held assist  Gait Pattern/deviations Step-through pattern;Narrow base of support (HHA on furniture)  General Gait Details pt is walking while holding on the furniture  Gait velocity reduced  Balance  Overall balance assessment Needs assistance  Sitting-balance support Feet supported  Sitting balance-Leahy Scale Fair  Postural control Posterior lean  Standing balance support Single extremity supported;Bilateral upper extremity supported  Standing balance-Leahy Scale Fair  Standing balance comment can walk with PT or hold furniture wiht PT guarding  General Comments  General comments (skin integrity, edema, etc.) pt is getting up to walk with helpand O2 sats were controlled on room air, but down to 90%  Exercises  Exercises Other exercises (strength 4- generally)  PT - End of Session  Equipment Utilized During Treatment Gait belt  Activity Tolerance Patient tolerated treatment well  Patient left in bed;with call bell/phone within reach  Nurse Communication Mobility status  PT Assessment  PT Recommendation/Assessment Patient needs continued PT services  PT Visit Diagnosis Unsteadiness on feet (R26.81);Muscle weakness (generalized) (M62.81);Difficulty in walking, not elsewhere classified (R26.2)  PT Problem List Decreased strength;Decreased range of motion;Decreased activity tolerance;Decreased balance;Decreased mobility;Decreased coordination;Decreased knowledge of use of DME;Cardiopulmonary status limiting activity  Barriers to Discharge Decreased caregiver support  Barriers to Discharge Comments requires help to walk  PT Plan  PT Frequency (ACUTE ONLY) Min 3X/week  PT Treatment/Interventions (ACUTE ONLY) DME instruction;Gait training;Functional mobility training;Therapeutic activities;Therapeutic exercise;Balance training;Neuromuscular re-education;Patient/family  education  AM-PAC PT "6 Clicks" Mobility Outcome Measure (Version 2)  Help needed  turning from your back to your side while in a flat bed without using bedrails? 3  Help needed moving from lying on your back to sitting on the side of a flat bed without using bedrails? 3  Help needed moving to and from a bed to a chair (including a wheelchair)? 3  Help needed standing up from a chair using your arms (e.g., wheelchair or bedside chair)? 3  Help needed to walk in hospital room? 3  Help needed climbing 3-5 steps with a railing?  2  6 Click Score 17  Consider Recommendation of Discharge To: Home with [facility]  PT Recommendation  Follow Up Recommendations SNF  PT equipment None recommended by PT  Individuals Consulted  Consulted and Agree with Results and Recommendations Patient  Acute Rehab PT Goals  Patient Stated Goal to get home  PT Goal Formulation With patient  Time For Goal Achievement 06/07/20  Potential to Achieve Goals Good  PT Time Calculation  PT Start Time (ACUTE ONLY) 1249  PT Stop Time (ACUTE ONLY) 1310  PT Time Calculation (min) (ACUTE ONLY) 21 min  PT General Charges  $$ ACUTE PT VISIT 1 Visit  PT Evaluation  $PT Eval Moderate Complexity 1 Mod  Written Expression  Dominant Hand Right    Mee Hives, PT MS Acute Rehab Dept. Number: [place]

## 2020-05-24 NOTE — ED Notes (Signed)
RN gave Meagan Dawson, daughter update

## 2020-05-24 NOTE — Progress Notes (Addendum)
PROGRESS NOTE                                                                                                                                                                                                             Patient Demographics:    Meagan Dawson, is a 70 y.o. female, DOB - 09-04-1950, [id number]  Outpatient Primary MD for the patient is Orpah Melter, MD   Admit date - 05/23/2020   LOS - 1  Chief Complaint  Patient presents with  . Shortness of Breath       Brief Narrative: Patient is a 70 y.o. 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  Per patient she was not able to walk after her HD yesterday.  Upon further evaluation in the emergency room-patient was found to have acute hypoxic respiratory failure due to COVID-19 and possible bacterial pneumonia.  See below for further details.  COVID-19 vaccinated status: Vaccinated-including booster  Significant Events: 1/24>> Admit to [facility] for hypoxia/generalized weakness due to COVID/bacterial pneumonia.  Significant studies: 1/24>> CTA chest: No PE, left > right pneumonia-mucus in the right lower lobe bronchi.   1/25>> bilateral lower extremity Doppler: No DVT  COVID-19 medications: Monoclonal antibody infusion on 1/13 Steroids: 1/24>> Remdesivir: 1/24>>  Antibiotics: Cefepime: 1/24>> Flagyl: 1/24>>  Microbiology data: 1/24>>Blood culture:pending 1/24>> respiratory virus panel: Negative  Procedures: None  Consults: PCCM  DVT prophylaxis: heparin injection 5,000 Units Start: 05/23/20 2330    Subjective:    [name] today feels better-still weak-still coughing-she was  titrated to room air this morning.   Assessment  & Plan :   Acute Hypoxic Resp Failure due to PNA: Suspect PNA mostly due to bacterial/aspiration etiology-although is significantly immunocompromised and differential is broad-she apparently does have a history of aspergillosis. Imaging studies not really very consistent with COVID-19 pneumonia.  Her hypoxia seems to have improved-she is mostly on room air today.  Plans are to continue with broad-spectrum IV antibiotic/steroids and Remdesivir (d/w with transplant MD at [facility] below). Have consulted PCCM-to evaluate and see if any further medication changes/work-up is potentially required.  Fever: afebrile O2  requirements:  SpO2: 92 % O2 Flow Rate (L/min): 2 L/min   COVID-19 Labs: Recent Labs    05/23/20 1701 05/24/20 0500  DDIMER 1.33* 1.29*  FERRITIN 2,012*  --   LDH 240*  --   CRP 4.6* 7.3*       Component Value Date/Time   BNP 1,044.6 (H) 05/23/2020 1701    Recent Labs  Lab 05/23/20 1701 05/24/20 0500  PROCALCITON 20.93 17.64    Lab Results  Component Value Date   SARSCOV2NAA POSITIVE (A) 05/23/2020     Prone/Incentive Spirometry: encouraged  incentive spirometry use 3-4/hour  Minimal troponin elevation: Probably due to COVID-19 related inflammation-CTA chest/lower extremity Doppler negative-since hypoxia has improved-doubt further work-up required.  Bilateral lung transplantation: Discussed with [facility] transplant team-Dr.Zaffiri-(pager [phone]) over the phone-recommend we continue with current antimicrobial therapy with cefepime/Flagyl, recommends Remdesivir x3 days.  Agrees that we continue with current/usual dosing of Prograf and mycophenolate.  Recommends that we resume prophylactic acyclovir, Zithromax and Bactrim.  Bed situation at [facility] remains tight-however he will contact transfer center to see if patient can be transferred to [facility] for continuity of care.  ESRD: On HD MWF-nephrology  consult.  Hypomagnesemia: Replete-recheck labs in a.m.  Normocytic anemia: Due to CKD-follow hemoglobin closely.  DM-2 (A1c 6.7 on 1/25): CBG stable with SSI-diet controlled at home-follow.  Recent Labs    05/24/20 0754  GLUCAP 164*   Mildly prolonged QTC: Replete potassium/magnesium-recheck twelve-lead EKG in am  Debility/deconditioning: Secondary to acute illness-palpation-she was not able to walk from HD yesterday-await PT/OT eval  ABG: No results found for: PHART, PCO2ART, PO2ART, HCO3, TCO2, ACIDBASEDEF, O2SAT  Vent Settings: N/A   Condition - Guarded  Family Communication  :  Daughter-[phone] left voicemail 1/25  Code Status :  Full Code  Diet :  Diet Order            Diet Carb Modified Fluid consistency: Thin; Room service appropriate? Yes  Diet effective now                  Disposition Plan  :   Status is: Inpatient  Remains inpatient appropriate because:Inpatient level of care appropriate due to severity of illness   Dispo: The patient is from: Home              Anticipated d/c is to: Home              Anticipated d/c date is: 2 days              Patient currently is not medically stable to d/c.   Difficult to place patient No    Barriers to discharge: Severely immunocompromised-with lung infiltrates-lung transplant patient-on broad-spectrum IV antimicrobial therapy-May require transfer back to [facility].  Antimicorbials  :    Anti-infectives (From admission, onward)   Start     Dose/Rate Route Frequency Ordered Stop   05/25/20 1000  remdesivir 100 mg in sodium chloride 0.9 % 100 mL IVPB       "Followed by" Linked Group Details   100 mg 200 mL/hr over 30 Minutes Intravenous Daily 05/23/20 2319 05/29/20 0959   05/24/20 2200  ceFEPIme (MAXIPIME) 1 g in sodium chloride 0.9 % 100 mL IVPB        1 g 200 mL/hr over 30 Minutes Intravenous Every 24 hours 05/23/20 2251     05/24/20 0100  remdesivir 200 mg in sodium chloride 0.9% 250 mL IVPB        "Followed by" Linked Group  Details   200 mg 580 mL/hr over 30 Minutes Intravenous Once 05/23/20 2319 05/24/20 0136   05/23/20 2300  metroNIDAZOLE (FLAGYL) tablet 500 mg        500 mg Oral Every 8 hours 05/23/20 2250     05/23/20 2115  ceFEPIme (MAXIPIME) 1 g in sodium chloride 0.9 % 100 mL IVPB  Status:  Discontinued        1 g 200 mL/hr over 30 Minutes Intravenous Every 24 hours 05/23/20 2104 05/23/20 2229      Inpatient Medications  Scheduled Meds: . vitamin C  500 mg Oral Daily  . aspirin EC  81 mg Oral Daily  . cholecalciferol  1,000 Units Oral Daily  . guaiFENesin  600 mg Oral BID  . heparin injection (subcutaneous)  5,000 Units Subcutaneous Q8H  . insulin aspart  0-5 Units Subcutaneous QHS  . insulin aspart  0-6 Units Subcutaneous TID WC  . methylPREDNISolone (SOLU-MEDROL) injection  40 mg Intravenous Q12H  . metroNIDAZOLE  500 mg Oral Q8H  . [START ON 05/25/2020] midodrine  10 mg Oral Q M,W,F  . mycophenolate  250 mg Oral BID  . tacrolimus  1 mg Oral Q12H  . zinc sulfate  220 mg Oral Daily   Continuous Infusions: . ceFEPime (MAXIPIME) IV    . [START ON 05/25/2020] remdesivir 100 mg in NS 100 mL     PRN Meds:.acetaminophen **OR** acetaminophen, albuterol, sodium chloride flush   Time Spent in minutes  35    See all Orders from today for further details   Oren Binet M.D on 05/24/2020 at 10:29 AM  To page go to [email or web address] - use universal password  [facility] -  Office  [phone]    Objective:   Vitals:   05/24/20 0415 05/24/20 0515 05/24/20 0818 05/24/20 1000  BP: 121/69 (!) 142/67 115/64 (!) 98/50  Pulse: 66 73 81 71  Resp: (!) 25 (!) 26 (!) 28 (!) 22  Temp:      TempSrc:      SpO2: 96% 96% 95% 92%  Weight:      Height:        Wt Readings from Last 3 Encounters:  05/23/20 34 kg  05/11/19 38.6 kg  06/15/16 36.7 kg     Intake/Output Summary (Last 24 hours) at 05/24/2020 1029 Last data filed at 05/24/2020 0136 Gross per 24 hour   Intake 350 ml  Output --  Net 350 ml     Physical Exam Gen Exam:Alert awake-not in any distress HEENT:atraumatic, normocephalic Chest: B/L clear to auscultation anteriorly CVS:S1S2 regular Abdomen:soft non tender, non distended Extremities:no edema Neurology: Non focal Skin: no rash   Data Review:    CBC Recent Labs  Lab 05/23/20 1317 05/24/20 0500  WBC 6.9 4.8  HGB 10.0* 8.8*  HCT 31.0* 29.3*  PLT 261 242  MCV 107.6* 111.8*  MCH 34.7* 33.6  MCHC 32.3 30.0  RDW 15.8* 15.9*  LYMPHSABS 0.2* 0.1*  MONOABS 0.7 0.1  EOSABS 0.0 0.0  BASOSABS 0.0 0.0    Chemistries  Recent Labs  Lab 05/23/20 1317 05/24/20 0500 05/24/20 0609  NA 138 138  --   K 2.7* 3.6  --   CL 96* 100  --   CO2 26 23  --   GLUCOSE 187* 156*  --   BUN 11 22  --   CREATININE 3.24* 5.03*  --   CALCIUM 8.1* 7.4*  --   MG  --   --  1.6*  AST 20 19  --   ALT 12 10  --   ALKPHOS 49 46  --   BILITOT 0.9 0.9  --    ------------------------------------------------------------------------------------------------------------------ Recent Labs    05/23/20 1930  TRIG 422*    Lab Results  Component Value Date   HGBA1C 6.7 (H) 05/24/2020   ------------------------------------------------------------------------------------------------------------------ No results for input(s): TSH, T4TOTAL, T3FREE, THYROIDAB in the last 72 hours.  Invalid input(s): FREET3 ------------------------------------------------------------------------------------------------------------------ Recent Labs    05/23/20 1701  FERRITIN 2,012*    Coagulation profile No results for input(s): INR, PROTIME in the last 168 hours.  Recent Labs    05/23/20 1701 05/24/20 0500  DDIMER 1.33* 1.29*    Cardiac Enzymes No results for input(s): CKMB, TROPONINI, MYOGLOBIN in the last 168 hours.  Invalid input(s):  CK ------------------------------------------------------------------------------------------------------------------    Component Value Date/Time   BNP 1,044.6 (H) 05/23/2020 1701    Micro Results Recent Results (from the past 240 hour(s))  SARS Coronavirus 2 by RT PCR (hospital order, performed in [facility] hospital lab) Nasopharyngeal Nasopharyngeal Swab     Status: Abnormal   Collection Time: 05/23/20  6:38 PM   Specimen: Nasopharyngeal Swab  Result Value Ref Range Status   SARS Coronavirus 2 POSITIVE (A) NEGATIVE Final    Comment: RESULT CALLED TO, READ BACK BY AND VERIFIED WITH: Trudi Ida RN 2007 05/23/20 A BROWNING (NOTE) SARS-CoV-2 target nucleic acids are DETECTED  SARS-CoV-2 RNA is generally detectable in upper respiratory specimens  during the acute phase of infection.  Positive results are indicative  of the presence of the identified virus, but do not rule out bacterial infection or co-infection with other pathogens not detected by the test.  Clinical correlation with patient history and  other diagnostic information is necessary to determine patient infection status.  The expected result is negative.  Fact Sheet for Patients:   StrictlyIdeas.no   Fact Sheet for Healthcare Providers:   BankingDealers.co.za    This test is not yet approved or cleared by the [place] FDA and  has been authorized for detection and/or diagnosis of SARS-CoV-2 by FDA under an Emergency Use Authorization (EUA).  This EUA will remain in effect (meaning this t est can be used) for the duration of  the COVID-19 declaration under Section 564(b)(1) of the Act, 21 U.S.C. section 360-bbb-3(b)(1), unless the authorization is terminated or revoked sooner.  Performed at [facility], [place] [address], [place], [place] [address]   Respiratory (~20 pathogens) panel by PCR     Status: None   Collection Time: 05/23/20  6:38 PM   Specimen:  Nasopharyngeal Swab; Respiratory  Result Value Ref Range Status   Adenovirus NOT DETECTED NOT DETECTED Final   Coronavirus 229E NOT DETECTED NOT DETECTED Final    Comment: (NOTE) The Coronavirus on the Respiratory Panel, DOES NOT test for the novel  Coronavirus (2019 nCoV)    Coronavirus HKU1 NOT DETECTED NOT DETECTED Final   Coronavirus NL63 NOT DETECTED NOT DETECTED Final   Coronavirus OC43 NOT DETECTED NOT DETECTED Final   Metapneumovirus NOT DETECTED NOT DETECTED Final   Rhinovirus / Enterovirus NOT DETECTED NOT DETECTED Final   Influenza A NOT DETECTED NOT DETECTED Final   Influenza B NOT DETECTED NOT DETECTED Final   Parainfluenza Virus 1 NOT DETECTED NOT DETECTED Final   Parainfluenza Virus 2 NOT DETECTED NOT DETECTED Final   Parainfluenza Virus 3 NOT DETECTED NOT DETECTED Final   Parainfluenza Virus 4 NOT DETECTED NOT DETECTED Final  Respiratory Syncytial Virus NOT DETECTED NOT DETECTED Final   Bordetella pertussis NOT DETECTED NOT DETECTED Final   Bordetella Parapertussis NOT DETECTED NOT DETECTED Final   Chlamydophila pneumoniae NOT DETECTED NOT DETECTED Final   Mycoplasma pneumoniae NOT DETECTED NOT DETECTED Final    Comment: Performed at [facility], [place] [address], [place], [place] [address]  MRSA PCR Screening     Status: None   Collection Time: 05/23/20  8:57 PM   Specimen: Nasal Mucosa; Nasopharyngeal  Result Value Ref Range Status   MRSA by PCR NEGATIVE NEGATIVE Final    Comment:        The GeneXpert MRSA Assay (FDA approved for NASAL specimens only), is one component of a comprehensive MRSA colonization surveillance program. It is not intended to diagnose MRSA infection nor to guide or monitor treatment for MRSA infections. Performed at [facility], [place] [address] [place], [place] [address]     Radiology Reports DG Chest 2 View  Result Date: 05/23/2020 CLINICAL DATA:  Shortness of breath over the past 2 weeks. EXAM: CHEST - 2 VIEW  COMPARISON:  01/18/2020 FINDINGS: Stable enlarged cardiac silhouette and postsurgical changes. The lungs are hyperexpanded with mild prominence of the interstitial markings. Significantly improved bilateral airspace opacity. Minimal bilateral pleural thickening or fluid with significant improvement. IMPRESSION: 1. Significantly improved bilateral pneumonia/edema with minimal residual bilateral pleural thickening or fluid. 2. Stable cardiomegaly and changes of COPD. Electronically Signed   By: Claudie Revering M.D.   On: 05/23/2020 13:37   CT Angio Chest PE W and/or Wo Contrast  Result Date: 05/23/2020 CLINICAL DATA:  70 year old female with chest pain and shortness of breath. EXAM: CT ANGIOGRAPHY CHEST WITH CONTRAST TECHNIQUE: Multidetector CT imaging of the chest was performed using the standard protocol during bolus administration of intravenous contrast. Multiplanar CT image reconstructions and MIPs were obtained to evaluate the vascular anatomy. CONTRAST:  9m OMNIPAQUE IOHEXOL 350 MG/ML SOLN COMPARISON:  Chest CT dated 01/15/2020. FINDINGS: Cardiovascular: There is mild cardiomegaly. No pericardial effusion. Mild atherosclerotic calcification of the thoracic aorta. No pulmonary artery embolus identified. Mediastinum/Nodes: No definite hilar adenopathy. Multiple surgical clips noted in the mediastinum. The esophagus is grossly unremarkable. There is retained ingested matter versus mucous secretion within the esophagus. No mediastinal fluid collection. Lungs/Pleura: Bilateral lower lobe predominant pulmonary opacities, left greater right most consistent with pneumonia or aspiration. Mucus secretion or aspirated content noted in the right lower lobe bronchi. No pleural effusion or pneumothorax. Upper Abdomen: Small hepatic hypodense lesions are not characterized. Musculoskeletal: Osteopenia with degenerative changes of the spine. Lower sternal fixation wire. Mild old appearing T12 compression fracture. No acute  osseous pathology. Review of the MIP images confirms the above findings. IMPRESSION: 1. No CT evidence of pulmonary artery embolus. 2. Bilateral lower lobe predominant pulmonary opacities, left greater right most consistent with pneumonia or aspiration. Mucus secretion or aspirated content noted in the right lower lobe bronchi. 3. Aortic Atherosclerosis (ICD10-I70.0). Electronically Signed   By: AAnner CreteM.D.   On: 05/23/2020 20:25

## 2020-05-24 NOTE — Progress Notes (Signed)
Bilateral lower extremity venous study completed.      Please see CV Proc for preliminary results.   Keelan Tripodi, RVT  

## 2020-05-24 NOTE — ED Notes (Signed)
Pt given incentive Spirometer and verified patient is using properly.

## 2020-05-24 NOTE — Consult Note (Signed)
[facility]  INPATIENT CONSULTATION  Reason for Consultation: ESRD Requesting Provider: Dr. Sloan Leiter  HPI: Meagan Dawson is an 70 y.o. female with complex medical history including double lung transplant 2016 [facility]) secondary to COPD, ESRD on HD MWF ([facility]), DM type 2, HTN, HL who is seen for evaluation and management of ESRD and assoc conditions.   Dr. Bernerd Pho 05/11/20, treated with MAb (home test).  Had persistent cough and fatigue and ultimately presented to ED last night. She's been admitted for further eval - empiric abx, pulmonary consulted, Remdesivir + medrol.    She had a full HD treatment yesterday and says usually HD goes ok but some intradialytic hypotension.  Takes midodrine preTx.  AVG working fine.   PMH: Past Medical History:  Diagnosis Date  . COPD (chronic obstructive pulmonary disease) ([place])   . Diabetes mellitus without complication ([place])   . Eczema   . Hyperlipidemia   . Hypertension    PSH: Past Surgical History:  Procedure Laterality Date  . LUNG TRANSPLANT, DOUBLE      Past Medical History:  Diagnosis Date  . COPD (chronic obstructive pulmonary disease) ([place])   . Diabetes mellitus without complication ([place])   . Eczema   . Hyperlipidemia   . Hypertension     Medications:  I have reviewed the patient's current medications.  (Not in a hospital admission)   ALLERGIES:   Allergies  Allergen Reactions  . Amoxicillin Rash    Arms and neck were red and swollen     FAM HX: Family History  Problem Relation Age of Onset  . Breast cancer Neg Hx     Social History:   reports that she quit smoking about 20 years ago. Her smoking use included cigarettes. She started smoking about 58 years ago. She smoked 1.50 packs per day. She has never used smokeless tobacco. She reports that she does not drink alcohol and does not use drugs.  ROS: 12 system ROS per HPI above  Blood pressure (!) 121/55, pulse 68, temperature 99.3 F (37.4 C),  temperature source Oral, resp. rate (!) 23, height '4\' 11"'$  (1.499 m), weight 34 kg, SpO2 96 %. PHYSICAL EXAM: Gen: nontoxic elderly woman on stretcher  Eyes: anicteric ENT: MMM Neck: supple CV:  RRR, III/VI SEM Abd:  Soft, thin Lungs: normal WOB, rales in L base > R base, a few scattered rhonchi GU: no foley Extr:  LUE AVG +t/b, dressings removed from yesterdays HD Neuro: nonfocal Skin: no rashes   Results for orders placed or performed during the hospital encounter of 05/23/20 (from the past 48 hour(s))  CBC with Differential     Status: Abnormal   Collection Time: 05/23/20  1:17 PM  Result Value Ref Range   WBC 6.9 4.0 - 10.5 K/uL   RBC 2.88 (L) 3.87 - 5.11 MIL/uL   Hemoglobin 10.0 (L) 12.0 - 15.0 g/dL   HCT 31.0 (L) 36.0 - 46.0 %   MCV 107.6 (H) 80.0 - 100.0 fL   MCH 34.7 (H) 26.0 - 34.0 pg   MCHC 32.3 30.0 - 36.0 g/dL   RDW 15.8 (H) 11.5 - 15.5 %   Platelets 261 150 - 400 K/uL   nRBC 0.0 0.0 - 0.2 %   Neutrophils Relative % 85 %   Neutro Abs 5.9 1.7 - 7.7 K/uL   Lymphocytes Relative 3 %   Lymphs Abs 0.2 (L) 0.7 - 4.0 K/uL   Monocytes Relative 10 %   Monocytes Absolute 0.7  0.1 - 1.0 K/uL   Eosinophils Relative 0 %   Eosinophils Absolute 0.0 0.0 - 0.5 K/uL   Basophils Relative 0 %   Basophils Absolute 0.0 0.0 - 0.1 K/uL   Immature Granulocytes 2 %   Abs Immature Granulocytes 0.12 (H) 0.00 - 0.07 K/uL    Comment: Performed at [facility] [address], [place], [place] [address]  Comprehensive metabolic panel     Status: Abnormal   Collection Time: 05/23/20  1:17 PM  Result Value Ref Range   Sodium 138 135 - 145 mmol/L   Potassium 2.7 (LL) 3.5 - 5.1 mmol/L    Comment: CRITICAL RESULT CALLED TO, READ BACK BY AND VERIFIED WITH: K. TATE RN @ 1456 05/23/20 LEONARD,A    Chloride 96 (L) 98 - 111 mmol/L   CO2 26 22 - 32 mmol/L   Glucose, Bld 187 (H) 70 - 99 mg/dL    Comment: Glucose reference range applies only to samples taken after fasting for at least 8 hours.    BUN 11 8 - 23 mg/dL   Creatinine, Ser 3.24 (H) 0.44 - 1.00 mg/dL   Calcium 8.1 (L) 8.9 - 10.3 mg/dL   Total Protein 5.6 (L) 6.5 - 8.1 g/dL   Albumin 2.8 (L) 3.5 - 5.0 g/dL   AST 20 15 - 41 U/L   ALT 12 0 - 44 U/L   Alkaline Phosphatase 49 38 - 126 U/L   Total Bilirubin 0.9 0.3 - 1.2 mg/dL   GFR, Estimated 15 (L) >60 mL/min    Comment: (NOTE) Calculated using the CKD-EPI Creatinine Equation (2021)    Anion gap 16 (H) 5 - 15    Comment: Performed at [facility], [employer] [address] [place], [place] [address]  Troponin I (High Sensitivity)     Status: Abnormal   Collection Time: 05/23/20  5:01 PM  Result Value Ref Range   Troponin I (High Sensitivity) 27 (H) <18 ng/L    Comment: (NOTE) Elevated high sensitivity troponin I (hsTnI) values and significant  changes across serial measurements may suggest ACS but many other  chronic and acute conditions are known to elevate hsTnI results.  Refer to the "Links" section for chest pain algorithms and additional  guidance. Performed at [facility], [place] [address], [place], [place] [address]   Brain natriuretic peptide     Status: Abnormal   Collection Time: 05/23/20  5:01 PM  Result Value Ref Range   B Natriuretic Peptide 1,044.6 (H) 0.0 - 100.0 pg/mL    Comment: Performed at [facility] [address] [place], [place] [address]  Lactic acid, plasma     Status: None   Collection Time: 05/23/20  5:01 PM  Result Value Ref Range   Lactic Acid, Venous 1.4 0.5 - 1.9 mmol/L    Comment: Performed at [facility] [address] [place], [place] [address]  D-dimer, quantitative     Status: Abnormal   Collection Time: 05/23/20  5:01 PM  Result Value Ref Range   D-Dimer, Quant 1.33 (H) 0.00 - 0.50 ug/mL-FEU    Comment: (NOTE) At the manufacturer cut-off value of 0.5 g/mL FEU, this assay has a negative predictive value of 95-100%.This assay is intended for use in conjunction with a clinical pretest probability (PTP)  assessment model to exclude pulmonary embolism (PE) and deep venous thrombosis (DVT) in outpatients suspected of PE or DVT. Results should be correlated with clinical presentation. Performed at  [facility], [place] [address], [place],  [address]   Procalcitonin     Status: None   Collection Time: 05/23/20  5:01 PM  Result Value Ref Range   Procalcitonin 20.93 ng/mL    Comment:        Interpretation: PCT >= 10 ng/mL: Important systemic inflammatory response, almost exclusively due to severe bacterial sepsis or septic shock. (NOTE)       Sepsis PCT Algorithm           Lower Respiratory Tract                                      Infection PCT Algorithm    ----------------------------     ----------------------------         PCT < 0.25 ng/mL                PCT < 0.10 ng/mL          Strongly encourage             Strongly discourage   discontinuation of antibiotics    initiation of antibiotics    ----------------------------     -----------------------------       PCT 0.25 - 0.50 ng/mL            PCT 0.10 - 0.25 ng/mL               OR       >80% decrease in PCT            Discourage initiation of                                            antibiotics      Encourage discontinuation           of antibiotics    ----------------------------     -----------------------------         PCT >= 0.50 ng/mL              PCT 0.26 - 0.50 ng/mL                AND       <80% decrease in PCT             Encourage initiation of                                             antibiotics       Encourage continuation           of antibiotics    ----------------------------     -----------------------------        PCT >= 0.50 ng/mL                  PCT > 0.50 ng/mL               AND         increase in PCT                  Strongly encourage  initiation of antibiotics    Strongly encourage escalation           of antibiotics                                      -----------------------------                                           PCT <= 0.25 ng/mL                                                 OR                                        > 80% decrease in PCT                                      Discontinue / Do not initiate                                             antibiotics  Performed at [facility], [address] [place], [place] [address]   Lactate dehydrogenase     Status: Abnormal   Collection Time: 05/23/20  5:01 PM  Result Value Ref Range   LDH 240 (H) 98 - 192 U/L    Comment: Performed at [facility], [place] [address], [place], [place] [address]  Ferritin     Status: Abnormal   Collection Time: 05/23/20  5:01 PM  Result Value Ref Range   Ferritin 2,012 (H) 11 - 307 ng/mL    Comment: Performed at [facility], [place] [address], [place], [place] [address]  Fibrinogen     Status: Abnormal   Collection Time: 05/23/20  5:01 PM  Result Value Ref Range   Fibrinogen 489 (H) 210 - 475 mg/dL    Comment: Performed at [facility] [address], [place], [place] [address]  C-reactive protein     Status: Abnormal   Collection Time: 05/23/20  5:01 PM  Result Value Ref Range   CRP 4.6 (H) <1.0 mg/dL    Comment: Performed at [facility], [place] [address] [place], [place] [address]  SARS Coronavirus 2 by RT PCR (hospital order, performed in [facility] hospital lab) Nasopharyngeal Nasopharyngeal Swab     Status: Abnormal   Collection Time: 05/23/20  6:38 PM   Specimen: Nasopharyngeal Swab  Result Value Ref Range   SARS Coronavirus 2 POSITIVE (A) NEGATIVE    Comment: RESULT CALLED TO, READ BACK BY AND VERIFIED WITH: Trudi Ida RN 2007 05/23/20 A BROWNING (NOTE) SARS-CoV-2 target nucleic acids are DETECTED  SARS-CoV-2 RNA is generally detectable in upper respiratory specimens  during the acute phase of infection.  Positive results are indicative  of the presence of the identified virus, but do not rule  out bacterial infection or co-infection with other pathogens not detected by the test.  Clinical correlation with patient history and  other diagnostic information is necessary to determine patient infection status.  The expected result is negative.  Fact Sheet for Patients:   StrictlyIdeas.no   Fact Sheet for Healthcare Providers:   BankingDealers.co.za    This test is not yet approved or cleared by the [place] FDA and  has been authorized for detection and/or diagnosis of SARS-CoV-2 by FDA under an Emergency Use Authorization (EUA).  This EUA will remain in effect (meaning this t est can be used) for the duration of  the COVID-19 declaration under Section 564(b)(1) of the Act, 21 U.S.C. section 360-bbb-3(b)(1), unless the authorization is terminated or revoked sooner.  Performed at [facility], [place] [address], [place], [place] [address]   Respiratory (~20 pathogens) panel by PCR     Status: None   Collection Time: 05/23/20  6:38 PM   Specimen: Nasopharyngeal Swab; Respiratory  Result Value Ref Range   Adenovirus NOT DETECTED NOT DETECTED   Coronavirus 229E NOT DETECTED NOT DETECTED    Comment: (NOTE) The Coronavirus on the Respiratory Panel, DOES NOT test for the novel  Coronavirus (2019 nCoV)    Coronavirus HKU1 NOT DETECTED NOT DETECTED   Coronavirus NL63 NOT DETECTED NOT DETECTED   Coronavirus OC43 NOT DETECTED NOT DETECTED   Metapneumovirus NOT DETECTED NOT DETECTED   Rhinovirus / Enterovirus NOT DETECTED NOT DETECTED   Influenza A NOT DETECTED NOT DETECTED   Influenza B NOT DETECTED NOT DETECTED   Parainfluenza Virus 1 NOT DETECTED NOT DETECTED   Parainfluenza Virus 2 NOT DETECTED NOT DETECTED   Parainfluenza Virus 3 NOT DETECTED NOT DETECTED   Parainfluenza Virus 4 NOT DETECTED NOT DETECTED   Respiratory Syncytial Virus NOT DETECTED NOT DETECTED   Bordetella pertussis NOT DETECTED NOT DETECTED    Bordetella Parapertussis NOT DETECTED NOT DETECTED   Chlamydophila pneumoniae NOT DETECTED NOT DETECTED   Mycoplasma pneumoniae NOT DETECTED NOT DETECTED    Comment: Performed at [facility], [place] [address], [place], [place] [address]  Blood Culture (routine x 2)     Status: None (Preliminary result)   Collection Time: 05/23/20  7:30 PM   Specimen: BLOOD  Result Value Ref Range   Specimen Description BLOOD RUE    Special Requests      BOTTLES DRAWN AEROBIC AND ANAEROBIC Blood Culture adequate volume Performed at [facility], [place] [address], [place], [place] [address]    Culture PENDING    Report Status PENDING   Troponin I (High Sensitivity)     Status: Abnormal   Collection Time: 05/23/20  7:30 PM  Result Value Ref Range   Troponin I (High Sensitivity) 25 (H) <18 ng/L    Comment: (NOTE) Elevated high sensitivity troponin I (hsTnI) values and significant  changes across serial measurements may suggest ACS but many other  chronic and acute conditions are known to elevate hsTnI results.  Refer to the "Links" section for chest pain algorithms and additional  guidance. Performed at [facility], [employer] [address] [place], [place] [address]   Triglycerides     Status: Abnormal   Collection Time: 05/23/20  7:30 PM  Result Value Ref Range   Triglycerides 422 (H) <150 mg/dL    Comment: Performed at [facility] [address] [place], [place] [address]  Blood Culture (routine x 2)     Status:  None (Preliminary result)   Collection Time: 05/23/20  7:45 PM   Specimen: BLOOD  Result Value Ref Range   Specimen Description BLOOD SITE NOT SPECIFIED    Special Requests      BOTTLES DRAWN AEROBIC AND ANAEROBIC Blood Culture adequate volume   Culture      NO GROWTH < 24 HOURS Performed at [facility], [place] [address], [place], [place] [address]    Report Status PENDING   MRSA PCR Screening     Status: None   Collection Time: 05/23/20  8:57 PM   Specimen:  Nasal Mucosa; Nasopharyngeal  Result Value Ref Range   MRSA by PCR NEGATIVE NEGATIVE    Comment:        The GeneXpert MRSA Assay (FDA approved for NASAL specimens only), is one component of a comprehensive MRSA colonization surveillance program. It is not intended to diagnose MRSA infection nor to guide or monitor treatment for MRSA infections. Performed at [facility], [place] [address] [place], [place] [address]   CBC with Differential/Platelet     Status: Abnormal   Collection Time: 05/24/20  5:00 AM  Result Value Ref Range   WBC 4.8 4.0 - 10.5 K/uL   RBC 2.62 (L) 3.87 - 5.11 MIL/uL   Hemoglobin 8.8 (L) 12.0 - 15.0 g/dL   HCT 29.3 (L) 36.0 - 46.0 %   MCV 111.8 (H) 80.0 - 100.0 fL   MCH 33.6 26.0 - 34.0 pg   MCHC 30.0 30.0 - 36.0 g/dL   RDW 15.9 (H) 11.5 - 15.5 %   Platelets 242 150 - 400 K/uL   nRBC 0.4 (H) 0.0 - 0.2 %   Neutrophils Relative % 96 %   Neutro Abs 4.6 1.7 - 7.7 K/uL   Lymphocytes Relative 2 %   Lymphs Abs 0.1 (L) 0.7 - 4.0 K/uL   Monocytes Relative 2 %   Monocytes Absolute 0.1 0.1 - 1.0 K/uL   Eosinophils Relative 0 %   Eosinophils Absolute 0.0 0.0 - 0.5 K/uL   Basophils Relative 0 %   Basophils Absolute 0.0 0.0 - 0.1 K/uL   nRBC 1 (H) 0 /100 WBC   Abs Immature Granulocytes 0.00 0.00 - 0.07 K/uL   Tear Drop Cells PRESENT    Polychromasia PRESENT     Comment: Performed at [facility], [place] [address] [place], [place] [address]  Comprehensive metabolic panel     Status: Abnormal   Collection Time: 05/24/20  5:00 AM  Result Value Ref Range   Sodium 138 135 - 145 mmol/L   Potassium 3.6 3.5 - 5.1 mmol/L   Chloride 100 98 - 111 mmol/L   CO2 23 22 - 32 mmol/L   Glucose, Bld 156 (H) 70 - 99 mg/dL    Comment: Glucose reference range applies only to samples taken after fasting for at least 8 hours.   BUN 22 8 - 23 mg/dL   Creatinine, Ser 5.03 (H) 0.44 - 1.00 mg/dL    Comment: DELTA CHECK NOTED   Calcium 7.4 (L) 8.9 - 10.3 mg/dL   Total  Protein 5.0 (L) 6.5 - 8.1 g/dL   Albumin 2.5 (L) 3.5 - 5.0 g/dL   AST 19 15 - 41 U/L   ALT 10 0 - 44 U/L   Alkaline Phosphatase 46 38 - 126 U/L   Total Bilirubin 0.9 0.3 - 1.2 mg/dL   GFR, Estimated 9 (L) >60 mL/min    Comment: (NOTE) Calculated using the  CKD-EPI Creatinine Equation (2021)    Anion gap 15 5 - 15    Comment: Performed at [facility], [place] [address], [place], [place] [address]  C-reactive protein     Status: Abnormal   Collection Time: 05/24/20  5:00 AM  Result Value Ref Range   CRP 7.3 (H) <1.0 mg/dL    Comment: Performed at [facility] [address] [place], [place] [address]  D-dimer, quantitative (not at [facility])     Status: Abnormal   Collection Time: 05/24/20  5:00 AM  Result Value Ref Range   D-Dimer, Quant 1.29 (H) 0.00 - 0.50 ug/mL-FEU    Comment: (NOTE) At the manufacturer cut-off value of 0.5 g/mL FEU, this assay has a negative predictive value of 95-100%.This assay is intended for use in conjunction with a clinical pretest probability (PTP) assessment model to exclude pulmonary embolism (PE) and deep venous thrombosis (DVT) in outpatients suspected of PE or DVT. Results should be correlated with clinical presentation. Performed at [facility], [place] [address] [place], [place] [address]   Procalcitonin     Status: None   Collection Time: 05/24/20  5:00 AM  Result Value Ref Range   Procalcitonin 17.64 ng/mL    Comment:        Interpretation: PCT >= 10 ng/mL: Important systemic inflammatory response, almost exclusively due to severe bacterial sepsis or septic shock. (NOTE)       Sepsis PCT Algorithm           Lower Respiratory Tract                                      Infection PCT Algorithm    ----------------------------     ----------------------------         PCT < 0.25 ng/mL                PCT < 0.10 ng/mL          Strongly encourage             Strongly discourage   discontinuation of antibiotics    initiation of  antibiotics    ----------------------------     -----------------------------       PCT 0.25 - 0.50 ng/mL            PCT 0.10 - 0.25 ng/mL               OR       >80% decrease in PCT            Discourage initiation of                                            antibiotics      Encourage discontinuation           of antibiotics    ----------------------------     -----------------------------         PCT >= 0.50 ng/mL              PCT 0.26 - 0.50 ng/mL                AND       <80% decrease in PCT             Encourage initiation  of                                             antibiotics       Encourage continuation           of antibiotics    ----------------------------     -----------------------------        PCT >= 0.50 ng/mL                  PCT > 0.50 ng/mL               AND         increase in PCT                  Strongly encourage                                      initiation of antibiotics    Strongly encourage escalation           of antibiotics                                     -----------------------------                                           PCT <= 0.25 ng/mL                                                 OR                                        > 80% decrease in PCT                                      Discontinue / Do not initiate                                             antibiotics  Performed at [facility], [address] [place], [place] [address]   Lactic acid, plasma     Status: None   Collection Time: 05/24/20  6:09 AM  Result Value Ref Range   Lactic Acid, Venous 1.0 0.5 - 1.9 mmol/L    Comment: Performed at [facility] [address], [place], [place] [address]  Magnesium     Status: Abnormal   Collection Time: 05/24/20  6:09 AM  Result Value Ref Range   Magnesium 1.6 (L) 1.7 - 2.4 mg/dL    Comment: Performed at [facility] [address], [place], [place] [address]  Hemoglobin A1c     Status: Abnormal    Collection Time: 05/24/20  6:09 AM  Result Value Ref Range   Hgb A1c MFr Bld 6.7 (H) 4.8 - 5.6 %    Comment: (NOTE) Pre diabetes:          5.7%-6.4%  Diabetes:              >6.4%  Glycemic control for   <7.0% adults with diabetes    Mean Plasma Glucose 145.59 mg/dL    Comment: Performed at [facility] [address], [place], [place] [address]  CBG monitoring, ED     Status: Abnormal   Collection Time: 05/24/20  7:54 AM  Result Value Ref Range   Glucose-Capillary 164 (H) 70 - 99 mg/dL    Comment: Glucose reference range applies only to samples taken after fasting for at least 8 hours.  CBG monitoring, ED     Status: Abnormal   Collection Time: 05/24/20 11:30 AM  Result Value Ref Range   Glucose-Capillary 245 (H) 70 - 99 mg/dL    Comment: Glucose reference range applies only to samples taken after fasting for at least 8 hours.    DG Chest 2 View  Result Date: 05/23/2020 CLINICAL DATA:  Shortness of breath over the past 2 weeks. EXAM: CHEST - 2 VIEW COMPARISON:  01/18/2020 FINDINGS: Stable enlarged cardiac silhouette and postsurgical changes. The lungs are hyperexpanded with mild prominence of the interstitial markings. Significantly improved bilateral airspace opacity. Minimal bilateral pleural thickening or fluid with significant improvement. IMPRESSION: 1. Significantly improved bilateral pneumonia/edema with minimal residual bilateral pleural thickening or fluid. 2. Stable cardiomegaly and changes of COPD. Electronically Signed   By: Claudie Revering M.D.   On: 05/23/2020 13:37   CT Angio Chest PE W and/or Wo Contrast  Result Date: 05/23/2020 CLINICAL DATA:  70 year old female with chest pain and shortness of breath. EXAM: CT ANGIOGRAPHY CHEST WITH CONTRAST TECHNIQUE: Multidetector CT imaging of the chest was performed using the standard protocol during bolus administration of intravenous contrast. Multiplanar CT image reconstructions and MIPs were obtained to evaluate the  vascular anatomy. CONTRAST:  44m OMNIPAQUE IOHEXOL 350 MG/ML SOLN COMPARISON:  Chest CT dated 01/15/2020. FINDINGS: Cardiovascular: There is mild cardiomegaly. No pericardial effusion. Mild atherosclerotic calcification of the thoracic aorta. No pulmonary artery embolus identified. Mediastinum/Nodes: No definite hilar adenopathy. Multiple surgical clips noted in the mediastinum. The esophagus is grossly unremarkable. There is retained ingested matter versus mucous secretion within the esophagus. No mediastinal fluid collection. Lungs/Pleura: Bilateral lower lobe predominant pulmonary opacities, left greater right most consistent with pneumonia or aspiration. Mucus secretion or aspirated content noted in the right lower lobe bronchi. No pleural effusion or pneumothorax. Upper Abdomen: Small hepatic hypodense lesions are not characterized. Musculoskeletal: Osteopenia with degenerative changes of the spine. Lower sternal fixation wire. Mild old appearing T12 compression fracture. No acute osseous pathology. Review of the MIP images confirms the above findings. IMPRESSION: 1. No CT evidence of pulmonary artery embolus. 2. Bilateral lower lobe predominant pulmonary opacities, left greater right most consistent with pneumonia or aspiration. Mucus secretion or aspirated content noted in the right lower lobe bronchi. 3. Aortic Atherosclerosis (ICD10-I70.0). Electronically Signed   By: AAnner CreteM.D.   On: 05/23/2020 20:25   VAS [place]OWER EXTREMITY VENOUS (DVT)  Result Date: 05/24/2020  Lower Venous DVT Study Indications: D-Dimer. Other Indications: Covid. Risk Factors: Surgery hx Lung transplant. Comparison Study: No previous exams Performing Technologist: LVonzell SchlatterRVT  Examination Guidelines: A complete evaluation includes B-mode imaging, spectral Doppler, color Doppler, and power Doppler  as needed of all accessible portions of each vessel. Bilateral testing is considered an integral part of a complete  examination. Limited examinations for reoccurring indications may be performed as noted. The reflux portion of the exam is performed with the patient in reverse Trendelenburg.  +---------+---------------+---------+-----------+----------+--------------+ RIGHT    CompressibilityPhasicitySpontaneityPropertiesThrombus Aging +---------+---------------+---------+-----------+----------+--------------+ CFV      Full           Yes      Yes                                 +---------+---------------+---------+-----------+----------+--------------+ SFJ      Full                                                        +---------+---------------+---------+-----------+----------+--------------+ FV Prox  Full                                                        +---------+---------------+---------+-----------+----------+--------------+ FV Mid   Full                                                        +---------+---------------+---------+-----------+----------+--------------+ FV DistalFull                                                        +---------+---------------+---------+-----------+----------+--------------+ PFV      Full                                                        +---------+---------------+---------+-----------+----------+--------------+ POP      Full           Yes      Yes                                 +---------+---------------+---------+-----------+----------+--------------+ PTV      Full                                                        +---------+---------------+---------+-----------+----------+--------------+ PERO     Full                                                        +---------+---------------+---------+-----------+----------+--------------+   +---------+---------------+---------+-----------+----------+--------------+  LEFT     CompressibilityPhasicitySpontaneityPropertiesThrombus Aging  +---------+---------------+---------+-----------+----------+--------------+ CFV      Full           Yes      Yes                                 +---------+---------------+---------+-----------+----------+--------------+ SFJ      Full                                                        +---------+---------------+---------+-----------+----------+--------------+ FV Prox  Full                                                        +---------+---------------+---------+-----------+----------+--------------+ FV Mid   Full                                                        +---------+---------------+---------+-----------+----------+--------------+ FV DistalFull                                                        +---------+---------------+---------+-----------+----------+--------------+ PFV      Full                                                        +---------+---------------+---------+-----------+----------+--------------+ POP      Full           Yes      Yes                                 +---------+---------------+---------+-----------+----------+--------------+ PTV      Full                                                        +---------+---------------+---------+-----------+----------+--------------+ PERO     Full                                                        +---------+---------------+---------+-----------+----------+--------------+  Summary: BILATERAL: - No evidence of deep vein thrombosis seen in the lower extremities, bilaterally. -No evidence of popliteal cyst, bilaterally.   *See table(s) above for measurements and observations. Electronically signed by Deitra Mayo MD on 05/24/2020 at 11:26:15 AM.    Final  Assessment/Plan **Hypoxic respiratory failure: COVID 19 +, being treated for bacterial PNA as well; pulm consulted.  [facility] aware she's here.   **ESRD on HD:  M[facility]; plan HD tomorrow per usual  schedule.  COVID + shift. Midodrine prior.   **S/p lung transplant: on immunosuppression, pulmonary following.   **DM: insulin per primary  **Anemia:  Hb 8.8, monitor - find out outpt ESA dosing, hold IV iron in setting of infection.   **BMM: corr ca ok, check phos  Justin Mend 05/24/2020, 3:34 PM

## 2020-05-25 ENCOUNTER — Inpatient Hospital Stay (HOSPITAL_COMMUNITY): Payer: Medicare Other

## 2020-05-25 DIAGNOSIS — I5031 Acute diastolic (congestive) heart failure: Secondary | ICD-10-CM

## 2020-05-25 LAB — CBC WITH DIFFERENTIAL/PLATELET
Abs Immature Granulocytes: 0.06 10*3/uL (ref 0.00–0.07)
Basophils Absolute: 0 10*3/uL (ref 0.0–0.1)
Basophils Relative: 0 %
Eosinophils Absolute: 0 10*3/uL (ref 0.0–0.5)
Eosinophils Relative: 0 %
HCT: 27.5 % — ABNORMAL LOW (ref 36.0–46.0)
Hemoglobin: 8.6 g/dL — ABNORMAL LOW (ref 12.0–15.0)
Immature Granulocytes: 2 %
Lymphocytes Relative: 9 %
Lymphs Abs: 0.3 10*3/uL — ABNORMAL LOW (ref 0.7–4.0)
MCH: 33.7 pg (ref 26.0–34.0)
MCHC: 31.3 g/dL (ref 30.0–36.0)
MCV: 107.8 fL — ABNORMAL HIGH (ref 80.0–100.0)
Monocytes Absolute: 0.4 10*3/uL (ref 0.1–1.0)
Monocytes Relative: 10 %
Neutro Abs: 2.6 10*3/uL (ref 1.7–7.7)
Neutrophils Relative %: 79 %
Platelets: 256 10*3/uL (ref 150–400)
RBC: 2.55 MIL/uL — ABNORMAL LOW (ref 3.87–5.11)
RDW: 15.6 % — ABNORMAL HIGH (ref 11.5–15.5)
WBC: 3.4 10*3/uL — ABNORMAL LOW (ref 4.0–10.5)
nRBC: 0.6 % — ABNORMAL HIGH (ref 0.0–0.2)

## 2020-05-25 LAB — COMPREHENSIVE METABOLIC PANEL
ALT: 11 U/L (ref 0–44)
AST: 19 U/L (ref 15–41)
Albumin: 2.4 g/dL — ABNORMAL LOW (ref 3.5–5.0)
Alkaline Phosphatase: 44 U/L (ref 38–126)
Anion gap: 17 — ABNORMAL HIGH (ref 5–15)
BUN: 42 mg/dL — ABNORMAL HIGH (ref 8–23)
CO2: 17 mmol/L — ABNORMAL LOW (ref 22–32)
Calcium: 7.5 mg/dL — ABNORMAL LOW (ref 8.9–10.3)
Chloride: 97 mmol/L — ABNORMAL LOW (ref 98–111)
Creatinine, Ser: 6.2 mg/dL — ABNORMAL HIGH (ref 0.44–1.00)
GFR, Estimated: 7 mL/min — ABNORMAL LOW (ref >60)
Glucose, Bld: 228 mg/dL — ABNORMAL HIGH (ref 70–99)
Potassium: 4 mmol/L (ref 3.5–5.1)
Sodium: 131 mmol/L — ABNORMAL LOW (ref 135–145)
Total Bilirubin: 0.8 mg/dL (ref 0.3–1.2)
Total Protein: 4.7 g/dL — ABNORMAL LOW (ref 6.5–8.1)

## 2020-05-25 LAB — PROCALCITONIN: Procalcitonin: 11.29 ng/mL

## 2020-05-25 LAB — GLUCOSE, CAPILLARY
Glucose-Capillary: 311 mg/dL — ABNORMAL HIGH (ref 70–99)
Glucose-Capillary: 267 mg/dL — ABNORMAL HIGH (ref 70–99)
Glucose-Capillary: 300 mg/dL — ABNORMAL HIGH (ref 70–99)
Glucose-Capillary: 161 mg/dL — ABNORMAL HIGH (ref 70–99)
Glucose-Capillary: 275 mg/dL — ABNORMAL HIGH (ref 70–99)

## 2020-05-25 LAB — ECHOCARDIOGRAM COMPLETE
AR max vel: 1.61 cm2
AV Area VTI: 1.61 cm2
AV Area mean vel: 1.54 cm2
AV Mean grad: 3 mmHg
AV Peak grad: 8 mmHg
Ao pk vel: 1.41 m/s
Area-P 1/2: 3.21 cm2
Height: 59 in
P 1/2 time: 879 msec
S' Lateral: 2.4 cm
Weight: 1200 oz

## 2020-05-25 LAB — HEPATITIS B SURFACE ANTIBODY,QUALITATIVE: Hep B S Ab: NONREACTIVE

## 2020-05-25 LAB — C-REACTIVE PROTEIN: CRP: 6.4 mg/dL — ABNORMAL HIGH (ref <1.0)

## 2020-05-25 LAB — D-DIMER, QUANTITATIVE: D-Dimer, Quant: 0.87 ug/mL-FEU — ABNORMAL HIGH (ref 0.00–0.50)

## 2020-05-25 LAB — HEPATITIS B SURFACE ANTIGEN: Hepatitis B Surface Ag: NONREACTIVE

## 2020-05-25 LAB — HEPATITIS B CORE ANTIBODY, TOTAL: Hep B Core Total Ab: NONREACTIVE

## 2020-05-25 MED ORDER — INSULIN ASPART 100 UNIT/ML ~~LOC~~ SOLN: 0.0000 [IU] | Freq: Three times a day (TID) | SUBCUTANEOUS | Status: DC

## 2020-05-25 MED ORDER — ENSURE ENLIVE PO LIQD
237.0000 mL | Freq: Two times a day (BID) | ORAL | Status: DC
  Administered 2020-05-25 (×2): 237 mL via ORAL

## 2020-05-25 MED ORDER — NEPRO/CARBSTEADY PO LIQD
237.0000 mL | Freq: Three times a day (TID) | ORAL | Status: DC
  Filled 2020-05-25: qty 237

## 2020-05-25 MED ORDER — PREDNISONE 20 MG PO TABS
40.0000 mg | ORAL_TABLET | Freq: Every day | ORAL | Status: DC
  Administered 2020-05-26: 40 mg via ORAL
  Filled 2020-05-25: qty 2

## 2020-05-25 MED ORDER — INSULIN DETEMIR 100 UNIT/ML ~~LOC~~ SOLN
6.0000 [IU] | Freq: Two times a day (BID) | SUBCUTANEOUS | Status: DC
  Administered 2020-05-25 – 2020-05-26 (×3): 6 [IU] via SUBCUTANEOUS
  Filled 2020-05-25 (×4): qty 0.06

## 2020-05-25 NOTE — Progress Notes (Signed)
[facility] Progress Note   Subjective:   Feeling ok today, no new issues.  Objective Vitals:   05/25/20 0010 05/25/20 0355 05/25/20 0400 05/25/20 0737  BP: 112/72 122/73 131/63 122/74  Pulse: 72 75 71 77  Resp: 20 (!) 23 (!) 24 (!) 23  Temp: (!) 97.2 F (36.2 C) 97.6 F (36.4 C) 98.2 F (36.8 C) 97.6 F (36.4 C)  TempSrc: Oral Oral Oral Oral  SpO2: 91% 95% 94% 93%  Weight:      Height:       Physical Exam Gen: nontoxic elderly woman on stretcher  Eyes: anicteric ENT: MMM Neck: supple CV:  RRR, III/VI SEM Abd:  Soft, thin Lungs: normal WOB, rales in L base > R base, a few scattered rhonchi GU: no foley Extr:  LUE AVG +t/b Neuro: nonfocal Skin: no rashes  Additional Objective Labs: Basic Metabolic Panel: Recent Labs  Lab 05/23/20 1317 05/24/20 0500 05/24/20 0559 05/25/20 0112  NA 138 138  --  131*  K 2.7* 3.6  --  4.0  CL 96* 100  --  97*  CO2 26 23  --  17*  GLUCOSE 187* 156*  --  228*  BUN 11 22  --  42*  CREATININE 3.24* 5.03*  --  6.20*  CALCIUM 8.1* 7.4*  --  7.5*  PHOS  --   --  3.8  --    Liver Function Tests: Recent Labs  Lab 05/23/20 1317 05/24/20 0500 05/25/20 0112  AST '20 19 19  '$ ALT '12 10 11  '$ ALKPHOS 49 46 44  BILITOT 0.9 0.9 0.8  PROT 5.6* 5.0* 4.7*  ALBUMIN 2.8* 2.5* 2.4*   No results for input(s): LIPASE, AMYLASE in the last 168 hours. CBC: Recent Labs  Lab 05/23/20 1317 05/24/20 0500 05/24/20 2222 05/25/20 0112  WBC 6.9 4.8  --  3.4*  NEUTROABS 5.9 4.6  --  2.6  HGB 10.0* 8.8* 8.6* 8.6*  HCT 31.0* 29.3* 28.1* 27.5*  MCV 107.6* 111.8*  --  107.8*  PLT 261 242  --  256   Blood Culture    Component Value Date/Time   SDES BLOOD SITE NOT SPECIFIED 05/23/2020 1945   SPECREQUEST  05/23/2020 1945    BOTTLES DRAWN AEROBIC AND ANAEROBIC Blood Culture adequate volume   CULT  05/23/2020 1945    NO GROWTH 2 DAYS Performed at [facility], [place] [address], [place], [place] [address]    REPTSTATUS PENDING  05/23/2020 1945    Cardiac Enzymes: No results for input(s): CKTOTAL, CKMB, CKMBINDEX, TROPONINI in the last 168 hours. CBG: Recent Labs  Lab 05/24/20 0754 05/24/20 1130 05/24/20 1648 05/24/20 2222 05/25/20 0739  GLUCAP 164* 245* 317* 311* 267*   Iron Studies:  Recent Labs    05/23/20 1701  FERRITIN 2,012*   '@lablastinr3'$ @ Studies/Results: DG Chest 2 View  Result Date: 05/23/2020 CLINICAL DATA:  Shortness of breath over the past 2 weeks. EXAM: CHEST - 2 VIEW COMPARISON:  01/18/2020 FINDINGS: Stable enlarged cardiac silhouette and postsurgical changes. The lungs are hyperexpanded with mild prominence of the interstitial markings. Significantly improved bilateral airspace opacity. Minimal bilateral pleural thickening or fluid with significant improvement. IMPRESSION: 1. Significantly improved bilateral pneumonia/edema with minimal residual bilateral pleural thickening or fluid. 2. Stable cardiomegaly and changes of COPD. Electronically Signed   By: Claudie Revering M.D.   On: 05/23/2020 13:37   CT Angio Chest PE W and/or Wo Contrast  Result Date: 05/23/2020 CLINICAL DATA:  70 year old female  with chest pain and shortness of breath. EXAM: CT ANGIOGRAPHY CHEST WITH CONTRAST TECHNIQUE: Multidetector CT imaging of the chest was performed using the standard protocol during bolus administration of intravenous contrast. Multiplanar CT image reconstructions and MIPs were obtained to evaluate the vascular anatomy. CONTRAST:  21m OMNIPAQUE IOHEXOL 350 MG/ML SOLN COMPARISON:  Chest CT dated 01/15/2020. FINDINGS: Cardiovascular: There is mild cardiomegaly. No pericardial effusion. Mild atherosclerotic calcification of the thoracic aorta. No pulmonary artery embolus identified. Mediastinum/Nodes: No definite hilar adenopathy. Multiple surgical clips noted in the mediastinum. The esophagus is grossly unremarkable. There is retained ingested matter versus mucous secretion within the esophagus. No mediastinal  fluid collection. Lungs/Pleura: Bilateral lower lobe predominant pulmonary opacities, left greater right most consistent with pneumonia or aspiration. Mucus secretion or aspirated content noted in the right lower lobe bronchi. No pleural effusion or pneumothorax. Upper Abdomen: Small hepatic hypodense lesions are not characterized. Musculoskeletal: Osteopenia with degenerative changes of the spine. Lower sternal fixation wire. Mild old appearing T12 compression fracture. No acute osseous pathology. Review of the MIP images confirms the above findings. IMPRESSION: 1. No CT evidence of pulmonary artery embolus. 2. Bilateral lower lobe predominant pulmonary opacities, left greater right most consistent with pneumonia or aspiration. Mucus secretion or aspirated content noted in the right lower lobe bronchi. 3. Aortic Atherosclerosis (ICD10-I70.0). Electronically Signed   By: AAnner CreteM.D.   On: 05/23/2020 20:25   VAS [place]OWER EXTREMITY VENOUS (DVT)  Result Date: 05/24/2020  Lower Venous DVT Study Indications: D-Dimer. Other Indications: Covid. Risk Factors: Surgery hx Lung transplant. Comparison Study: No previous exams Performing Technologist: LVonzell SchlatterRVT  Examination Guidelines: A complete evaluation includes B-mode imaging, spectral Doppler, color Doppler, and power Doppler as needed of all accessible portions of each vessel. Bilateral testing is considered an integral part of a complete examination. Limited examinations for reoccurring indications may be performed as noted. The reflux portion of the exam is performed with the patient in reverse Trendelenburg.  +---------+---------------+---------+-----------+----------+--------------+ RIGHT    CompressibilityPhasicitySpontaneityPropertiesThrombus Aging +---------+---------------+---------+-----------+----------+--------------+ CFV      Full           Yes      Yes                                  +---------+---------------+---------+-----------+----------+--------------+ SFJ      Full                                                        +---------+---------------+---------+-----------+----------+--------------+ FV Prox  Full                                                        +---------+---------------+---------+-----------+----------+--------------+ FV Mid   Full                                                        +---------+---------------+---------+-----------+----------+--------------+ FV DistalFull                                                        +---------+---------------+---------+-----------+----------+--------------+  PFV      Full                                                        +---------+---------------+---------+-----------+----------+--------------+ POP      Full           Yes      Yes                                 +---------+---------------+---------+-----------+----------+--------------+ PTV      Full                                                        +---------+---------------+---------+-----------+----------+--------------+ PERO     Full                                                        +---------+---------------+---------+-----------+----------+--------------+   +---------+---------------+---------+-----------+----------+--------------+ LEFT     CompressibilityPhasicitySpontaneityPropertiesThrombus Aging +---------+---------------+---------+-----------+----------+--------------+ CFV      Full           Yes      Yes                                 +---------+---------------+---------+-----------+----------+--------------+ SFJ      Full                                                        +---------+---------------+---------+-----------+----------+--------------+ FV Prox  Full                                                         +---------+---------------+---------+-----------+----------+--------------+ FV Mid   Full                                                        +---------+---------------+---------+-----------+----------+--------------+ FV DistalFull                                                        +---------+---------------+---------+-----------+----------+--------------+ PFV      Full                                                        +---------+---------------+---------+-----------+----------+--------------+  POP      Full           Yes      Yes                                 +---------+---------------+---------+-----------+----------+--------------+ PTV      Full                                                        +---------+---------------+---------+-----------+----------+--------------+ PERO     Full                                                        +---------+---------------+---------+-----------+----------+--------------+  Summary: BILATERAL: - No evidence of deep vein thrombosis seen in the lower extremities, bilaterally. -No evidence of popliteal cyst, bilaterally.   *See table(s) above for measurements and observations. Electronically signed by Deitra Mayo MD on 05/24/2020 at 11:26:15 AM.    Final    Medications: . ceFEPime (MAXIPIME) IV Stopped (05/24/20 2327)  . remdesivir 100 mg in NS 100 mL 100 mg (05/25/20 1057)   . acyclovir  400 mg Oral BID  . vitamin C  500 mg Oral Daily  . aspirin EC  81 mg Oral Daily  . atorvastatin  10 mg Oral Daily  . azithromycin  250 mg Oral Once per day on Mon Wed Fri  . Chlorhexidine Gluconate Cloth  6 each Topical Q0600  . cholecalciferol  1,000 Units Oral Daily  . feeding supplement  237 mL Oral BID BM  . guaiFENesin  600 mg Oral BID  . heparin injection (subcutaneous)  5,000 Units Subcutaneous Q8H  . insulin aspart  0-5 Units Subcutaneous QHS  . insulin aspart  0-6 Units Subcutaneous TID WC  .  methylPREDNISolone (SOLU-MEDROL) injection  40 mg Intravenous Q12H  . metroNIDAZOLE  500 mg Oral Q8H  . midodrine  10 mg Oral Q M,W,F  . mycophenolate  250 mg Oral BID  . pantoprazole (PROTONIX) IV  40 mg Intravenous Q12H  . [START ON 05/26/2020] sulfamethoxazole-trimethoprim  1 tablet Oral Once per day on Mon Thu  . tacrolimus  1 mg Oral Q12H  . zinc sulfate  220 mg Oral Daily    Assessment/Plan **Hypoxic respiratory failure: COVID 19 +, s/p lung transplant, being treated for bacterial PNA as well; pulm consulted.  [facility] aware she's here.   **ESRD on HD:  [facility]; plan HD today per usual schedule.  COVID + shift. Midodrine prior. No UF today.  **S/p lung transplant: on immunosuppression, pulmonary following.   **DM: insulin per primary, high with the steroids.  **Anemia:  Hb 8.8, monitor - find out outpt ESA dosing, hold IV iron in setting of infection.   **BMM: corr ca ok, check phos  Jannifer Hick MD 05/25/2020, 11:10 AM  [facility] Pager: [phone]

## 2020-05-25 NOTE — Evaluation (Signed)
Occupational Therapy Evaluation Patient Details Name: Meagan Dawson MRN: [id number] DOB: 17-Dec-1950 Today's Date: 05/25/2020    History of Present Illness 70 yo female with onset of recent Covid+ test was noted to have fever, cough and fatigue, hypoxia.  PMHx:  B lung transplant 2016, COPD, HD, HLD, DM   Clinical Impression   Pt admitted with above. She demonstrates the below listed deficits and will benefit from continued OT to maximize safety and independence with BADLs.  Pt presents to OT with generalized weakness, impaired balance, decreased activity tolerance.  She currently requires min guard assist to min A for ADLs.  She lives with her daughter and son in law, who both work during the day.  She reports she was independent with ADLs PTA.  She will need 24 hour supervision/assist initially upon discharge and family is unable to provide this, per pt.  Recommend SNF level rehab at discharge.       Follow Up Recommendations  SNF;Supervision/Assistance - 24 hour    Equipment Recommendations  None recommended by OT    Recommendations for Other Services       Precautions / Restrictions Precautions Precautions: Fall Precaution Comments: monitor O2 sats      Mobility Bed Mobility Overal bed mobility: Needs Assistance Bed Mobility: Supine to Sit;Sit to Supine     Supine to sit: Min guard Sit to supine: Min guard   General bed mobility comments: increased time and effort with HOB elevated    Transfers Overall transfer level: Needs assistance Equipment used: Rolling walker (2 wheeled) Transfers: Sit to/from [facility] Sit to Stand: Min assist Stand pivot transfers: Min guard       General transfer comment: assist to steady when moving into standing    Balance Overall balance assessment: Needs assistance Sitting-balance support: Feet supported Sitting balance-Leahy Scale: Fair     Standing balance support: Single extremity supported Standing  balance-Leahy Scale: Fair Standing balance comment: Pt requires UE support                           ADL either performed or assessed with clinical judgement   ADL Overall ADL's : Needs assistance/impaired Eating/Feeding: Independent   Grooming: Wash/dry hands;Wash/dry face;Oral care;Applying deodorant;Min guard;Standing   Upper Body Bathing: Set up;Sitting   Lower Body Bathing: Minimal assistance;Sit to/from stand   Upper Body Dressing : Set up;Sitting   Lower Body Dressing: Minimal assistance;Sit to/from stand Lower Body Dressing Details (indicate cue type and reason): due to fatigue Toilet Transfer: Min guard;Ambulation;Comfort height toilet;Grab bars;RW   Toileting- Water quality scientist and Hygiene: Min guard;Sit to/from stand       Functional mobility during ADLs: Min guard;Rolling walker General ADL Comments: DOE 3/4 with activity     Vision Baseline Vision/History: Wears glasses Wears Glasses: At all times Patient Visual Report: No change from baseline       Perception     Praxis      Pertinent Vitals/Pain Pain Assessment: No/denies pain     Hand Dominance Right   Extremity/Trunk Assessment Upper Extremity Assessment Upper Extremity Assessment: Generalized weakness   Lower Extremity Assessment Lower Extremity Assessment: Defer to PT evaluation   Cervical / Trunk Assessment Cervical / Trunk Assessment: Normal   Communication Communication Communication: No difficulties   Cognition Arousal/Alertness: Awake/alert Behavior During Therapy: WFL for tasks assessed/performed Overall Cognitive Status: Within Functional Limits for tasks assessed  General Comments  Sp02 87-92% on RA, HR 65    Exercises     Shoulder Instructions      Home Living Family/patient expects to be discharged to:: Private residence Living Arrangements: Children Available Help at Discharge: Available  PRN/intermittently;Family Type of Home: House Home Access: Level entry     Home Layout: One level     Bathroom Shower/Tub: Teacher, early years/pre: Standard     Home Equipment: Cane - single point   Additional Comments: pt takes tub baths.  Daughter and son in law work during the day      Prior Functioning/Environment Level of Independence: Independent with assistive device(s)                 OT Problem List: Decreased strength;Impaired balance (sitting and/or standing);Decreased activity tolerance;Decreased safety awareness;Decreased knowledge of use of DME or AE;Cardiopulmonary status limiting activity      OT Treatment/Interventions: Self-care/ADL training;Therapeutic exercise;Energy conservation;DME and/or AE instruction;Therapeutic activities;Patient/family education    OT Goals(Current goals can be found in the care plan section) Acute Rehab OT Goals Patient Stated Goal: to get stronger OT Goal Formulation: With patient Time For Goal Achievement: 06/08/20 Potential to Achieve Goals: Good ADL Goals Pt Will Perform Grooming: with supervision;standing Pt Will Perform Upper Body Bathing: with set-up;sitting Pt Will Perform Lower Body Bathing: with supervision;sit to/from stand Pt Will Perform Upper Body Dressing: with set-up;sitting Pt Will Perform Lower Body Dressing: with supervision;sit to/from stand Pt Will Transfer to Toilet: ambulating;with supervision;regular height toilet;bedside commode;grab bars Pt Will Perform Toileting - Clothing Manipulation and hygiene: with supervision;sit to/from stand Additional ADL Goal #1: Pt will independently incorporate energy conservation strategies during ADLs  OT Frequency: Min 2X/week   Barriers to D/C: Decreased caregiver support  family works during the day       Co-evaluation              AM-PAC OT "6 Clicks" Daily Activity     Outcome Measure Help from another person eating meals?: None Help  from another person taking care of personal grooming?: A Little Help from another person toileting, which includes using toliet, bedpan, or urinal?: A Little Help from another person bathing (including washing, rinsing, drying)?: A Little Help from another person to put on and taking off regular upper body clothing?: A Little Help from another person to put on and taking off regular lower body clothing?: A Little 6 Click Score: 19   End of Session Equipment Utilized During Treatment: Rolling walker Nurse Communication: Mobility status  Activity Tolerance: Patient limited by fatigue Patient left: in bed;with call bell/phone within reach;with bed alarm set  OT Visit Diagnosis: Unsteadiness on feet (R26.81)                Time: [id number] OT Time Calculation (min): 14 min Charges:  OT General Charges $OT Visit: 1 Visit OT Evaluation $OT Eval Moderate Complexity: 1 Mod  Meagan Sugg C., OTR/L Acute Rehabilitation Services Pager [phone] Office [phone]   Lucille Passy M 05/25/2020, 5:03 PM

## 2020-05-25 NOTE — Progress Notes (Signed)
OT Cancellation Note  Patient Details Name: Meagan Dawson MRN: [id number] DOB: 04-Aug-1950   Cancelled Treatment:    Reason Eval/Treat Not Completed: Patient at procedure or test/ unavailable - undergoing ECHO.  Will reattempt.  Nilsa Nutting., OTR/L Acute Rehabilitation Services Pager [phone] Office [phone]  Lucille Passy M 05/25/2020, 11:41 AM

## 2020-05-25 NOTE — Evaluation (Signed)
Clinical/Bedside Swallow Evaluation Patient Details  Name: Maurine Cradle MRN: [id number] Date of Birth: 01-01-1951  Today's Date: 05/25/2020 Time: SLP Start Time (ACUTE ONLY): 1000 SLP Stop Time (ACUTE ONLY): 1015 SLP Time Calculation (min) (ACUTE ONLY): 15 min  Past Medical History:  Past Medical History:  Diagnosis Date  . COPD (chronic obstructive pulmonary disease) ([place])   . Diabetes mellitus without complication ([place])   . Eczema   . Hyperlipidemia   . Hypertension    Past Surgical History:  Past Surgical History:  Procedure Laterality Date  . 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 She notes been in her usual state of health until approximately 3 days prior to admission. She noted increasing shortness of breath and just feeling lack of energy. She did not endorse copious secretions. She is currently in the emergency department at [facility] on room air with O2 saturations of 96%. Pt found to have [place] which MD suspects may be an incidental findi. Dr Elsworth Soho reports "Bibasal nodular pulmonary opacities which seem to be similar to what she had last year -nondiagnostic bronchoscopy & swift resolution makes opportunistic infections less likely -Aspiration is possible and I agree with swallow evaluation." No prior history of dysphagia found in chart   Assessment / Plan / Recommendation Clinical Impression  Pt demonstrates no overt signs of aspiration during PO intake. She denies any dysphagia. She is edentulous and at low risk of oral bacteria build up contributing to aspiration pna. She additionally has had a Nissen Funduplication to reduce risk of aspiration of gactric contents. Pt reports an MBS in 2016 after her lung transplant, but no impairment. Given these findings, dysphagia related  aspiration pna is unlikely but not impossible. If MD would like an instrumental assessment  an MBS can be done on thursday afternoon (time slot for COVID pts). Please order MBS if this is desired SLP Visit Diagnosis: Dysphagia, oropharyngeal phase (R13.12)    Aspiration Risk  Mild aspiration risk    Diet Recommendation     Supervision: Patient able to self feed Compensations: Slow rate;Small sips/bites    Other  Recommendations     Follow up Recommendations        Frequency and Duration            Prognosis        Swallow Study   General HPI: 70 year old female with a past medical history significant for severe COPD that required bilateral lung transplant 2016 outside hospital that is being monitored by [facility]. She notes been in her usual state of health until approximately 3 days prior to admission. She noted increasing shortness of breath and just feeling lack of energy. She did not endorse copious secretions. She is currently in the emergency department at [facility] on room air with O2 saturations of 96%. Pt found to have [place] which MD suspects may be an incidental findi. Dr Elsworth Soho reports "Bibasal nodular pulmonary opacities which seem to be similar to what she had last year -nondiagnostic bronchoscopy & swift resolution makes opportunistic infections less likely -Aspiration is possible and I agree with swallow evaluation." No prior history of dysphagia found in chart Type of Study: Bedside Swallow Evaluation Diet Prior to this Study: Regular Temperature Spikes Noted: No Respiratory Status: Room air History of Recent Intubation: No Behavior/Cognition:  Alert;Cooperative;Pleasant mood Oral Cavity Assessment: Within Functional Limits Oral Care Completed by SLP: No Oral Cavity - Dentition: Adequate natural dentition Patient Positioning: Upright in bed Baseline Vocal Quality: Normal Volitional Cough: Strong    Oral/Motor/Sensory Function Overall  Oral Motor/Sensory Function: Within functional limits   Ice Chips     Thin Liquid Thin Liquid: Within functional limits    Nectar Thick Nectar Thick Liquid: Not tested   Honey Thick Honey Thick Liquid: Not tested   Puree Puree: Within functional limits   Solid     Solid: Within functional limits     Herbie [place], MA [place] Pager [phone] Office [phone]  Lynann Beaver 05/25/2020,2:01 PM

## 2020-05-25 NOTE — [place] Notes (Addendum)
PROGRESS NOTE                                                                                                                                                                                                             Patient Demographics:    Meagan Dawson, is a 70 y.o. female, DOB - May 26, 1950, [id number]  Outpatient Primary MD for the patient is Orpah Melter, MD   Admit date - 05/23/2020   LOS - 2  Chief Complaint  Patient presents with  . Shortness of Breath       Brief Narrative: Patient is a 70 y.o. 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  Per patient she was not able to walk after her HD yesterday.  Upon further evaluation in the emergency room-patient was found to have acute hypoxic respiratory failure due to COVID-19 and possible bacterial pneumonia.  See below for further details.  COVID-19 vaccinated status: Vaccinated-including booster  Significant Events: 1/24>> Admit to [facility] for hypoxia/generalized weakness due to COVID/bacterial pneumonia.  Significant studies: 1/24>> CTA chest: No PE, left > right pneumonia-mucus in the right lower lobe bronchi.   1/25>> bilateral lower extremity Doppler: No DVT  COVID-19 medications: Monoclonal antibody infusion on 1/13 Steroids: 1/24>> Remdesivir: 1/24>>  Antibiotics: Cefepime: 1/24>> Flagyl: 1/24>>  Microbiology data: 1/24>>Blood culture:pending 1/24>> respiratory virus panel: Negative  Procedures: None  Consults: PCCM  DVT prophylaxis: heparin injection 5,000 Units Start: 05/23/20 2330    Subjective:   Matthews good-on room air.  Continues to complain of generalized weakness.   Some cough.   Assessment  & Plan :   Acute Hypoxic Resp Failure due to healthcare associated pneumonia and possible COVID-19 pneumonia: Hypoxia has resolved-suspect this is mostly a bacterial pneumonia-but given her immunocompromise status-differential is broad-she apparently has a history of aspergillosis as well.  Improving with empiric antibiotics-steroids and Remdesivir.   Fever: afebrile O2 requirements:  SpO2: 91 % O2 Flow Rate (L/min): 2 L/min   COVID-19 Labs: Recent Labs    05/23/20 1701 05/24/20 0500 05/25/20 0112  DDIMER 1.33* 1.29* 0.87*  FERRITIN 2,012*  --   --  LDH 240*  --   --   CRP 4.6* 7.3* 6.4*       Component Value Date/Time   BNP 1,044.6 (H) 05/23/2020 1701    Recent Labs  Lab 05/23/20 1701 05/24/20 0500 05/25/20 0112  PROCALCITON 20.93 17.64 11.29    Lab Results  Component Value Date   SARSCOV2NAA POSITIVE (A) 05/23/2020     Prone/Incentive Spirometry: encouraged  incentive spirometry use 3-4/hour  Minimal troponin elevation: Probably due to COVID-19 related inflammation-CTA chest/lower extremity Doppler negative-since hypoxia has improved-doubt further work-up required.  Bilateral lung transplantation: Discussed with [facility] transplant team-Dr.Zaffiri-on 1/25 (pager [phone]) over the phone-recommends Remdesivir x2 days-recommends that we continue cefepime and Flagyl.  After discussion with Dr Zaffiri-Prograf/mycophenolate-and prophylactic acyclovir/Zithromax and Bactrim was resumed.  Awaiting transfer to [facility].    We will update transplant MD later today.  Addendum 5:50 PM: Spoke with Dr Drenda Freeze discussed admissions patient clinically improved-he recommends finishing up Remdesivir x3 days, and subsequently discharging patient possibly on Levaquin and Flagyl to complete 1 week of therapy.  Will reassess on 1/27-if stable-we will plan on discharge with home health services if okay with patient and family (daughter was leaning towards  going home with home health when I talked to her this morning)  ESRD: On HD MWF-nephrology consult.  Hypomagnesemia: Repleted-we will repeat magnesium levels tomorrow.  Normocytic anemia: Due to CKD-follow hemoglobin closely.  DM-2 (A1c 6.7 on 1/25) with uncontrolled hyperglycemia due to steroids: CBG on the higher side-start Levemir 6 units twice daily, change SSI to sensitive scale.  Follow. follow.  Recent Labs    05/24/20 2222 05/25/20 0739 05/25/20 1158  GLUCAP 311* 267* 300*   Mildly prolonged QTC: Improving-recheck EKG/K/Mg levels tomorrow morning.  Debility/deconditioning: Secondary to acute illness-palpation-she was not able to walk from HD yesterday-await repeat PT/OT eval today.  Daughter leaning towards taking her home with home health.  ABG: No results found for: PHART, PCO2ART, PO2ART, HCO3, TCO2, ACIDBASEDEF, O2SAT  Vent Settings: N/A   Condition - Guarded  Family Communication  :  Daughter-[phone] left voicemail 1/26  Code Status :  Full Code  Diet :  Diet Order            Diet Carb Modified Fluid consistency: Thin; Room service appropriate? Yes  Diet effective now                  Disposition Plan  :   Status is: Inpatient  Remains inpatient appropriate because:Inpatient level of care appropriate due to severity of illness   Dispo: The patient is from: Home              Anticipated d/c is to: Home              Anticipated d/c date is: 2 days              Patient currently is not medically stable to d/c.   Difficult to place patient No   Barriers to discharge: Severely immunocompromised-with lung infiltrates-lung transplant patient-on broad-spectrum IV antimicrobial therapy-May require transfer back to [facility].  Antimicorbials  :    Anti-infectives (From admission, onward)   Start     Dose/Rate Route Frequency Ordered Stop   05/26/20 0900  sulfamethoxazole-trimethoprim (BACTRIM) 400-80 MG per tablet 1 tablet       Note to Pharmacy:  On Monday and Thursday     1 tablet Oral Once per day on Mon Thu 05/24/20 1116     05/25/20 1000  remdesivir 100  mg in sodium chloride 0.9 % 100 mL IVPB       "Followed by" Linked Group Details   100 mg 200 mL/hr over 30 Minutes Intravenous Daily 05/23/20 2319 05/29/20 0959   05/25/20 0900  azithromycin (ZITHROMAX) tablet 250 mg        250 mg Oral Once per day on Mon Wed Fri 05/24/20 1116     05/24/20 2200  ceFEPIme (MAXIPIME) 1 g in sodium chloride 0.9 % 100 mL IVPB        1 g 200 mL/hr over 30 Minutes Intravenous Every 24 hours 05/23/20 2251     05/24/20 1130  acyclovir (ZOVIRAX) tablet 400 mg        400 mg Oral 2 times daily 05/24/20 1116     05/24/20 0100  remdesivir 200 mg in sodium chloride 0.9% 250 mL IVPB       "Followed by" Linked Group Details   200 mg 580 mL/hr over 30 Minutes Intravenous Once 05/23/20 2319 05/24/20 0136   05/23/20 2300  metroNIDAZOLE (FLAGYL) tablet 500 mg        500 mg Oral Every 8 hours 05/23/20 2250     05/23/20 2115  ceFEPIme (MAXIPIME) 1 g in sodium chloride 0.9 % 100 mL IVPB  Status:  Discontinued        1 g 200 mL/hr over 30 Minutes Intravenous Every 24 hours 05/23/20 2104 05/23/20 2229      Inpatient Medications  Scheduled Meds: . acyclovir  400 mg Oral BID  . vitamin C  500 mg Oral Daily  . aspirin EC  81 mg Oral Daily  . atorvastatin  10 mg Oral Daily  . azithromycin  250 mg Oral Once per day on Mon Wed Fri  . Chlorhexidine Gluconate Cloth  6 each Topical Q0600  . cholecalciferol  1,000 Units Oral Daily  . feeding supplement  237 mL Oral BID BM  . guaiFENesin  600 mg Oral BID  . heparin injection (subcutaneous)  5,000 Units Subcutaneous Q8H  . insulin aspart  0-5 Units Subcutaneous QHS  . insulin aspart  0-6 Units Subcutaneous TID WC  . methylPREDNISolone (SOLU-MEDROL) injection  40 mg Intravenous Q12H  . metroNIDAZOLE  500 mg Oral Q8H  . midodrine  10 mg Oral Q M,W,F  . mycophenolate  250 mg Oral BID  . pantoprazole (PROTONIX) IV  40  mg Intravenous Q12H  . [START ON 05/26/2020] sulfamethoxazole-trimethoprim  1 tablet Oral Once per day on Mon Thu  . tacrolimus  1 mg Oral Q12H  . zinc sulfate  220 mg Oral Daily   Continuous Infusions: . ceFEPime (MAXIPIME) IV Stopped (05/24/20 2327)  . remdesivir 100 mg in NS 100 mL 100 mg (05/25/20 1057)   PRN Meds:.acetaminophen **OR** acetaminophen, albuterol, sodium chloride flush   Time Spent in minutes  25    See all Orders from today for further details   Oren Binet M.D on 05/25/2020 at 12:22 PM  To page go to [email or web address] - use universal password  [facility] -  Office  [phone]    Objective:   Vitals:   05/25/20 0355 05/25/20 0400 05/25/20 0737 05/25/20 1156  BP: 122/73 131/63 122/74 (!) 138/54  Pulse: 75 71 77 70  Resp: (!) 23 (!) 24 (!) 23 19  Temp: 97.6 F (36.4 C) 98.2 F (36.8 C) 97.6 F (36.4 C) (!) 97.4 F (36.3 C)  TempSrc: Oral Oral Oral Oral  SpO2: 95% 94% 93% 91%  Weight:  Height:        Wt Readings from Last 3 Encounters:  05/23/20 34 kg  05/11/19 38.6 kg  06/15/16 36.7 kg     Intake/Output Summary (Last 24 hours) at 05/25/2020 1222 Last data filed at 05/25/2020 0318 Gross per 24 hour  Intake 200 ml  Output --  Net 200 ml     Physical Exam Gen Exam:Alert awake-not in any distress HEENT:atraumatic, normocephalic Chest: B/L clear to auscultation anteriorly CVS:S1S2 regular Abdomen:soft non tender, non distended Extremities:no edema Neurology: Non focal Skin: no rash   Data Review:    CBC Recent Labs  Lab 05/23/20 1317 05/24/20 0500 05/24/20 2222 05/25/20 0112  WBC 6.9 4.8  --  3.4*  HGB 10.0* 8.8* 8.6* 8.6*  HCT 31.0* 29.3* 28.1* 27.5*  PLT 261 242  --  256  MCV 107.6* 111.8*  --  107.8*  MCH 34.7* 33.6  --  33.7  MCHC 32.3 30.0  --  31.3  RDW 15.8* 15.9*  --  15.6*  LYMPHSABS 0.2* 0.1*  --  0.3*  MONOABS 0.7 0.1  --  0.4  EOSABS 0.0 0.0  --  0.0  BASOSABS 0.0 0.0  --  0.0    Chemistries   Recent Labs  Lab 05/23/20 1317 05/24/20 0500 05/24/20 0609 05/25/20 0112  NA 138 138  --  131*  K 2.7* 3.6  --  4.0  CL 96* 100  --  97*  CO2 26 23  --  17*  GLUCOSE 187* 156*  --  228*  BUN 11 22  --  42*  CREATININE 3.24* 5.03*  --  6.20*  CALCIUM 8.1* 7.4*  --  7.5*  MG  --   --  1.6*  --   AST 20 19  --  19  ALT 12 10  --  11  ALKPHOS 49 46  --  44  BILITOT 0.9 0.9  --  0.8   ------------------------------------------------------------------------------------------------------------------ Recent Labs    05/23/20 1930  TRIG 422*    Lab Results  Component Value Date   HGBA1C 6.7 (H) 05/24/2020   ------------------------------------------------------------------------------------------------------------------ No results for input(s): TSH, T4TOTAL, T3FREE, THYROIDAB in the last 72 hours.  Invalid input(s): FREET3 ------------------------------------------------------------------------------------------------------------------ Recent Labs    05/23/20 1701  FERRITIN 2,012*    Coagulation profile No results for input(s): INR, PROTIME in the last 168 hours.  Recent Labs    05/24/20 0500 05/25/20 0112  DDIMER 1.29* 0.87*    Cardiac Enzymes No results for input(s): CKMB, TROPONINI, MYOGLOBIN in the last 168 hours.  Invalid input(s): CK ------------------------------------------------------------------------------------------------------------------    Component Value Date/Time   BNP 1,044.6 (H) 05/23/2020 1701    Micro Results Recent Results (from the past 240 hour(s))  SARS Coronavirus 2 by RT PCR (hospital order, performed in [facility] hospital lab) Nasopharyngeal Nasopharyngeal Swab     Status: Abnormal   Collection Time: 05/23/20  6:38 PM   Specimen: Nasopharyngeal Swab  Result Value Ref Range Status   SARS Coronavirus 2 POSITIVE (A) NEGATIVE Final    Comment: RESULT CALLED TO, READ BACK BY AND VERIFIED WITH: Trudi Ida RN 2007 05/23/20 A  BROWNING (NOTE) SARS-CoV-2 target nucleic acids are DETECTED  SARS-CoV-2 RNA is generally detectable in upper respiratory specimens  during the acute phase of infection.  Positive results are indicative  of the presence of the identified virus, but do not rule out bacterial infection or co-infection with other pathogens not detected by the test.  Clinical correlation with patient  history and  other diagnostic information is necessary to determine patient infection status.  The expected result is negative.  Fact Sheet for Patients:   StrictlyIdeas.no   Fact Sheet for Healthcare Providers:   BankingDealers.co.za    This test is not yet approved or cleared by the [place] FDA and  has been authorized for detection and/or diagnosis of SARS-CoV-2 by FDA under an Emergency Use Authorization (EUA).  This EUA will remain in effect (meaning this t est can be used) for the duration of  the COVID-19 declaration under Section 564(b)(1) of the Act, 21 U.S.C. section 360-bbb-3(b)(1), unless the authorization is terminated or revoked sooner.  Performed at [facility], [place] [address], [place], [place] [address]   Respiratory (~20 pathogens) panel by PCR     Status: None   Collection Time: 05/23/20  6:38 PM   Specimen: Nasopharyngeal Swab; Respiratory  Result Value Ref Range Status   Adenovirus NOT DETECTED NOT DETECTED Final   Coronavirus 229E NOT DETECTED NOT DETECTED Final    Comment: (NOTE) The Coronavirus on the Respiratory Panel, DOES NOT test for the novel  Coronavirus (2019 nCoV)    Coronavirus HKU1 NOT DETECTED NOT DETECTED Final   Coronavirus NL63 NOT DETECTED NOT DETECTED Final   Coronavirus OC43 NOT DETECTED NOT DETECTED Final   Metapneumovirus NOT DETECTED NOT DETECTED Final   Rhinovirus / Enterovirus NOT DETECTED NOT DETECTED Final   Influenza A NOT DETECTED NOT DETECTED Final   Influenza B NOT DETECTED NOT DETECTED  Final   Parainfluenza Virus 1 NOT DETECTED NOT DETECTED Final   Parainfluenza Virus 2 NOT DETECTED NOT DETECTED Final   Parainfluenza Virus 3 NOT DETECTED NOT DETECTED Final   Parainfluenza Virus 4 NOT DETECTED NOT DETECTED Final   Respiratory Syncytial Virus NOT DETECTED NOT DETECTED Final   Bordetella pertussis NOT DETECTED NOT DETECTED Final   Bordetella Parapertussis NOT DETECTED NOT DETECTED Final   Chlamydophila pneumoniae NOT DETECTED NOT DETECTED Final   Mycoplasma pneumoniae NOT DETECTED NOT DETECTED Final    Comment: Performed at [facility], [place]. [address] [place], [place] [address]  Blood Culture (routine x 2)     Status: None (Preliminary result)   Collection Time: 05/23/20  7:30 PM   Specimen: BLOOD  Result Value Ref Range Status   Specimen Description BLOOD RUE  Final   Special Requests   Final    BOTTLES DRAWN AEROBIC AND ANAEROBIC Blood Culture adequate volume   Culture   Final    NO GROWTH 2 DAYS Performed at [facility], [place] [address] [place], [place] [address]    Report Status PENDING  Incomplete  Blood Culture (routine x 2)     Status: None (Preliminary result)   Collection Time: 05/23/20  7:45 PM   Specimen: BLOOD  Result Value Ref Range Status   Specimen Description BLOOD SITE NOT SPECIFIED  Final   Special Requests   Final    BOTTLES DRAWN AEROBIC AND ANAEROBIC Blood Culture adequate volume   Culture   Final    NO GROWTH 2 DAYS Performed at [facility], [address] [place], [place] [address]    Report Status PENDING  Incomplete  MRSA PCR Screening     Status: None   Collection Time: 05/23/20  8:57 PM   Specimen: Nasal Mucosa; Nasopharyngeal  Result Value Ref Range Status   MRSA by PCR NEGATIVE NEGATIVE Final    Comment:  The GeneXpert MRSA Assay (FDA approved for NASAL specimens only), is one component of a comprehensive MRSA colonization surveillance program. It is not intended to diagnose MRSA infection nor to  guide or monitor treatment for MRSA infections. Performed at [facility], [place] [address] [place], [place] [address]     Radiology Reports DG Chest 2 View  Result Date: 05/23/2020 CLINICAL DATA:  Shortness of breath over the past 2 weeks. EXAM: CHEST - 2 VIEW COMPARISON:  01/18/2020 FINDINGS: Stable enlarged cardiac silhouette and postsurgical changes. The lungs are hyperexpanded with mild prominence of the interstitial markings. Significantly improved bilateral airspace opacity. Minimal bilateral pleural thickening or fluid with significant improvement. IMPRESSION: 1. Significantly improved bilateral pneumonia/edema with minimal residual bilateral pleural thickening or fluid. 2. Stable cardiomegaly and changes of COPD. Electronically Signed   By: Claudie Revering M.D.   On: 05/23/2020 13:37   CT Angio Chest PE W and/or Wo Contrast  Result Date: 05/23/2020 CLINICAL DATA:  70 year old female with chest pain and shortness of breath. EXAM: CT ANGIOGRAPHY CHEST WITH CONTRAST TECHNIQUE: Multidetector CT imaging of the chest was performed using the standard protocol during bolus administration of intravenous contrast. Multiplanar CT image reconstructions and MIPs were obtained to evaluate the vascular anatomy. CONTRAST:  7m OMNIPAQUE IOHEXOL 350 MG/ML SOLN COMPARISON:  Chest CT dated 01/15/2020. FINDINGS: Cardiovascular: There is mild cardiomegaly. No pericardial effusion. Mild atherosclerotic calcification of the thoracic aorta. No pulmonary artery embolus identified. Mediastinum/Nodes: No definite hilar adenopathy. Multiple surgical clips noted in the mediastinum. The esophagus is grossly unremarkable. There is retained ingested matter versus mucous secretion within the esophagus. No mediastinal fluid collection. Lungs/Pleura: Bilateral lower lobe predominant pulmonary opacities, left greater right most consistent with pneumonia or aspiration. Mucus secretion or aspirated content noted in the right  lower lobe bronchi. No pleural effusion or pneumothorax. Upper Abdomen: Small hepatic hypodense lesions are not characterized. Musculoskeletal: Osteopenia with degenerative changes of the spine. Lower sternal fixation wire. Mild old appearing T12 compression fracture. No acute osseous pathology. Review of the MIP images confirms the above findings. IMPRESSION: 1. No CT evidence of pulmonary artery embolus. 2. Bilateral lower lobe predominant pulmonary opacities, left greater right most consistent with pneumonia or aspiration. Mucus secretion or aspirated content noted in the right lower lobe bronchi. 3. Aortic Atherosclerosis (ICD10-I70.0). Electronically Signed   By: AAnner CreteM.D.   On: 05/23/2020 20:25   VAS [place]OWER EXTREMITY VENOUS (DVT)  Result Date: 05/24/2020  Lower Venous DVT Study Indications: D-Dimer. Other Indications: Covid. Risk Factors: Surgery hx Lung transplant. Comparison Study: No previous exams Performing Technologist: LVonzell SchlatterRVT  Examination Guidelines: A complete evaluation includes B-mode imaging, spectral Doppler, color Doppler, and power Doppler as needed of all accessible portions of each vessel. Bilateral testing is considered an integral part of a complete examination. Limited examinations for reoccurring indications may be performed as noted. The reflux portion of the exam is performed with the patient in reverse Trendelenburg.  +---------+---------------+---------+-----------+----------+--------------+ RIGHT    CompressibilityPhasicitySpontaneityPropertiesThrombus Aging +---------+---------------+---------+-----------+----------+--------------+ CFV      Full           Yes      Yes                                 +---------+---------------+---------+-----------+----------+--------------+ SFJ      Full                                                        +---------+---------------+---------+-----------+----------+--------------+  FV Prox  Full                                                         +---------+---------------+---------+-----------+----------+--------------+ FV Mid   Full                                                        +---------+---------------+---------+-----------+----------+--------------+ FV DistalFull                                                        +---------+---------------+---------+-----------+----------+--------------+ PFV      Full                                                        +---------+---------------+---------+-----------+----------+--------------+ POP      Full           Yes      Yes                                 +---------+---------------+---------+-----------+----------+--------------+ PTV      Full                                                        +---------+---------------+---------+-----------+----------+--------------+ PERO     Full                                                        +---------+---------------+---------+-----------+----------+--------------+   +---------+---------------+---------+-----------+----------+--------------+ LEFT     CompressibilityPhasicitySpontaneityPropertiesThrombus Aging +---------+---------------+---------+-----------+----------+--------------+ CFV      Full           Yes      Yes                                 +---------+---------------+---------+-----------+----------+--------------+ SFJ      Full                                                        +---------+---------------+---------+-----------+----------+--------------+ FV Prox  Full                                                        +---------+---------------+---------+-----------+----------+--------------+  FV Mid   Full                                                        +---------+---------------+---------+-----------+----------+--------------+ FV DistalFull                                                         +---------+---------------+---------+-----------+----------+--------------+ PFV      Full                                                        +---------+---------------+---------+-----------+----------+--------------+ POP      Full           Yes      Yes                                 +---------+---------------+---------+-----------+----------+--------------+ PTV      Full                                                        +---------+---------------+---------+-----------+----------+--------------+ PERO     Full                                                        +---------+---------------+---------+-----------+----------+--------------+  Summary: BILATERAL: - No evidence of deep vein thrombosis seen in the lower extremities, bilaterally. -No evidence of popliteal cyst, bilaterally.   *See table(s) above for measurements and observations. Electronically signed by Deitra Mayo MD on 05/24/2020 at 11:26:15 AM.    Final

## 2020-05-25 NOTE — [place] Notes (Signed)
  Echocardiogram 2D Echocardiogram has been performed.  Marybelle Killings 05/25/2020, 12:52 PM

## 2020-05-25 NOTE — [place] Notes (Signed)
Inpatient Diabetes Program Recommendations  AACE/ADA: New Consensus Statement on Inpatient Glycemic Control (2015)  Target Ranges:  Prepandial:   less than 140 mg/dL      Peak postprandial:   less than 180 mg/dL (1-2 hours)      Critically ill patients:  140 - 180 mg/dL   Lab Results  Component Value Date   GLUCAP 267 (H) 05/25/2020   HGBA1C 6.7 (H) 05/24/2020    Review of Glycemic Control Results for NI, ASIEDU (MRN [id number]) as of 05/25/2020 10:15  Ref. Range 05/24/2020 11:30 05/24/2020 16:48 05/24/2020 22:22 05/25/2020 07:39  Glucose-Capillary Latest Ref Range: 70 - 99 mg/dL 245 (H) 317 (H) 311 (H) 267 (H)   Diabetes history: Type 2 DM Outpatient Diabetes medications: none  Current orders for Inpatient glycemic control: Novolog 0-6 units TID, Novolog 0-5 units QHS Solumedrol 40 mg BID  Inpatient Diabetes Program Recommendations:    Consider adding Levemir 3 units BID and Tradjenta 5 mg QD.   Thanks, Bronson Curb, MSN, RNC-OB Diabetes Coordinator [phone] (8a-5p)

## 2020-05-25 NOTE — Progress Notes (Signed)
Initial Nutrition Assessment  DOCUMENTATION CODES:   Underweight  INTERVENTION:  Provide Nepro Shake po TID, each supplement provides 425 kcal and 19 grams protein.  Encourage adequate PO intake.   NUTRITION DIAGNOSIS:   Increased nutrient needs related to chronic illness (ESRD HD) as evidenced by estimated needs.  GOAL:   Patient will meet greater than or equal to 90% of their needs  MONITOR:   PO intake,Supplement acceptance,Skin,Weight trends,Labs,I & O's  REASON FOR ASSESSMENT:   Consult,Malnutrition Screening Tool Assessment of nutrition requirement/status  ASSESSMENT:   71 y.o. female with PMHx of severe COPD-s/p bilateral lung transplant in 2016, ESRD on HD, DM-2, HTN, HLD-diagnosed with COVID-19 on 1/12-presenting with several days history of shortness of breath and generalized weakness. Pt found to have acute hypoxic respiratory failure due to COVID-19 and possible bacterial pneumonia.   Pt currently in HD. RD unable to obtain pt nutrition history at this time. RD to order nutritional supplements to aid in caloric and protein needs. Unable to complete Nutrition-Focused physical exam at this time.   Labs and medications reviewed.   Diet Order:   Diet Order            Diet Carb Modified Fluid consistency: Thin; Room service appropriate? Yes  Diet effective now                 EDUCATION NEEDS:   Not appropriate for education at this time  Skin:  Skin Assessment: Reviewed RN Assessment  Last BM:  Unknown  Height:   Ht Readings from Last 1 Encounters:  05/23/20 '4\' 11"'$  (1.499 m)    Weight:   Wt Readings from Last 1 Encounters:  05/23/20 34 kg    Ideal Body Weight:  44.5 kg  BMI:  Body mass index is 15.15 kg/m.  Estimated Nutritional Needs:   Kcal:  1500-1800  Protein:  70-90 grams  Fluid:  >/= 1.5 L/day  Corrin Parker, MS, RD, LDN RD pager number/after hours weekend pager number on Amion.

## 2020-05-26 ENCOUNTER — Other Ambulatory Visit (HOSPITAL_COMMUNITY): Payer: Self-pay | Admitting: Internal Medicine

## 2020-05-26 LAB — COMPREHENSIVE METABOLIC PANEL
ALT: 7 U/L (ref 0–44)
AST: 20 U/L (ref 15–41)
Albumin: 2.4 g/dL — ABNORMAL LOW (ref 3.5–5.0)
Alkaline Phosphatase: 51 U/L (ref 38–126)
Anion gap: 15 (ref 5–15)
BUN: 24 mg/dL — ABNORMAL HIGH (ref 8–23)
CO2: 22 mmol/L (ref 22–32)
Calcium: 7.4 mg/dL — ABNORMAL LOW (ref 8.9–10.3)
Chloride: 98 mmol/L (ref 98–111)
Creatinine, Ser: 3.73 mg/dL — ABNORMAL HIGH (ref 0.44–1.00)
GFR, Estimated: 12 mL/min — ABNORMAL LOW (ref >60)
Glucose, Bld: 148 mg/dL — ABNORMAL HIGH (ref 70–99)
Potassium: 3.4 mmol/L — ABNORMAL LOW (ref 3.5–5.1)
Sodium: 135 mmol/L (ref 135–145)
Total Bilirubin: 0.5 mg/dL (ref 0.3–1.2)
Total Protein: 4.7 g/dL — ABNORMAL LOW (ref 6.5–8.1)

## 2020-05-26 LAB — GLUCOSE, CAPILLARY
Glucose-Capillary: 52 mg/dL — ABNORMAL LOW (ref 70–99)
Glucose-Capillary: 122 mg/dL — ABNORMAL HIGH (ref 70–99)
Glucose-Capillary: 122 mg/dL — ABNORMAL HIGH (ref 70–99)

## 2020-05-26 LAB — CBC WITH DIFFERENTIAL/PLATELET
Abs Immature Granulocytes: 0.08 10*3/uL — ABNORMAL HIGH (ref 0.00–0.07)
Basophils Absolute: 0 10*3/uL (ref 0.0–0.1)
Basophils Relative: 0 %
Eosinophils Absolute: 0 10*3/uL (ref 0.0–0.5)
Eosinophils Relative: 0 %
HCT: 28.6 % — ABNORMAL LOW (ref 36.0–46.0)
Hemoglobin: 9 g/dL — ABNORMAL LOW (ref 12.0–15.0)
Immature Granulocytes: 1 %
Lymphocytes Relative: 5 %
Lymphs Abs: 0.3 10*3/uL — ABNORMAL LOW (ref 0.7–4.0)
MCH: 33.6 pg (ref 26.0–34.0)
MCHC: 31.5 g/dL (ref 30.0–36.0)
MCV: 106.7 fL — ABNORMAL HIGH (ref 80.0–100.0)
Monocytes Absolute: 0.8 10*3/uL (ref 0.1–1.0)
Monocytes Relative: 12 %
Neutro Abs: 5 10*3/uL (ref 1.7–7.7)
Neutrophils Relative %: 82 %
Platelets: 266 10*3/uL (ref 150–400)
RBC: 2.68 MIL/uL — ABNORMAL LOW (ref 3.87–5.11)
RDW: 15.5 % (ref 11.5–15.5)
WBC: 6.2 10*3/uL (ref 4.0–10.5)
nRBC: 0.3 % — ABNORMAL HIGH (ref 0.0–0.2)

## 2020-05-26 LAB — PROCALCITONIN: Procalcitonin: 8.46 ng/mL

## 2020-05-26 LAB — MAGNESIUM: Magnesium: 2.1 mg/dL (ref 1.7–2.4)

## 2020-05-26 LAB — D-DIMER, QUANTITATIVE: D-Dimer, Quant: 1.01 ug/mL-FEU — ABNORMAL HIGH (ref 0.00–0.50)

## 2020-05-26 LAB — C-REACTIVE PROTEIN: CRP: 4.4 mg/dL — ABNORMAL HIGH (ref <1.0)

## 2020-05-26 MED ORDER — SODIUM CHLORIDE 0.9 % IV SOLN: 1.0000 g | INTRAVENOUS | Status: DC

## 2020-05-26 MED ORDER — SODIUM CHLORIDE 0.9 % IV SOLN
1.0000 g | Freq: Once | INTRAVENOUS | Status: AC
  Administered 2020-05-26: 1 g via INTRAVENOUS
  Filled 2020-05-26: qty 1

## 2020-05-26 MED ORDER — LEVOFLOXACIN 500 MG PO TABS: 500.0000 mg | ORAL_TABLET | ORAL | 0 refills | Status: DC

## 2020-05-26 MED ORDER — METRONIDAZOLE 500 MG PO TABS: 500.0000 mg | ORAL_TABLET | Freq: Three times a day (TID) | ORAL | 0 refills | Status: DC

## 2020-05-26 MED ORDER — POTASSIUM CHLORIDE CRYS ER 20 MEQ PO TBCR
40.0000 meq | EXTENDED_RELEASE_TABLET | Freq: Once | ORAL | Status: AC
  Administered 2020-05-26: 40 meq via ORAL
  Filled 2020-05-26: qty 2

## 2020-05-26 MED ORDER — NEPRO/CARBSTEADY PO LIQD: 237.0000 mL | Freq: Three times a day (TID) | ORAL | 0 refills | Status: AC

## 2020-05-26 MED ORDER — AZITHROMYCIN 250 MG PO TABS: 250.0000 mg | ORAL_TABLET | ORAL | 0 refills | Status: AC

## 2020-05-26 MED ORDER — GUAIFENESIN ER 600 MG PO TB12: 600.0000 mg | ORAL_TABLET | Freq: Two times a day (BID) | ORAL | 0 refills | Status: DC

## 2020-05-26 MED FILL — levoFLOXacin 500 MG TABS: 500 | 4 days supply | Qty: 2 | Fill #0

## 2020-05-26 MED FILL — MUCUS RELIEF 600 MG TB12: 600 | 7 days supply | Qty: 14 | Fill #0

## 2020-05-26 MED FILL — metroNIDAZOLE 500 MG TABS: 500 | 3 days supply | Qty: 9 | Fill #0

## 2020-05-26 NOTE — Progress Notes (Signed)
Pharmacy Antibiotic Note  Meagan Dawson is a 70 y.o. female admitted on 05/23/2020 with SOB. Pharmacy has been consulted for cefepime dosing for PNA. Patient is s/p bilateral lung transplant in 2016. CT negative for PE and found bilateral lower lobe opacities left greater and right - most consistent with PNA. Patient is ESRD on HD MWF per Care Everywhere. Patient reported rash with amoxicillin - discussed with Dr. Roslynn Amble and given low likelihood of cross reactivity will proceed with cefepime.  Plan: Continue cefepime 1g IV q24h  Metronidazole per MD   Height: '4\' 11"'$  (149.9 cm) Weight: 34 kg (75 lb) IBW/kg (Calculated) : 43.2  Temp (24hrs), Avg:97.5 F (36.4 C), Min:97.4 F (36.3 C), Max:97.6 F (36.4 C)  Recent Labs  Lab 05/23/20 1317 05/23/20 1701 05/24/20 0500 05/24/20 0609 05/25/20 0112 05/26/20 0049  WBC 6.9  --  4.8  --  3.4* 6.2  CREATININE 3.24*  --  5.03*  --  6.20* 3.73*  LATICACIDVEN  --  1.4  --  1.0  --   --     Estimated Creatinine Clearance: 7.4 mL/min (A) (by C-G formula based on SCr of 3.73 mg/dL (H)).    Allergies  Allergen Reactions  . Amoxicillin Rash    Arms and neck were red and swollen     Antimicrobials this admission: Cefepime 1/24 >>   Dose adjustments this admission: N/a  Microbiology results: 1/24 Blood NGTD 1/24 MRSA PCR negative   Meagan Dawson A. Levada Dy, PharmD, BCPS, FNKF Clinical Pharmacist [place] Please utilize Amion for appropriate phone number to reach the unit pharmacist ([place])    05/26/2020 8:45 AM

## 2020-05-26 NOTE — TOC Initial Note (Signed)
Transition of Care [facility]) - Initial/Assessment Note    Patient Details  Name: Meagan Dawson MRN: [id number] Date of Birth: 07-14-1950  [facility] CM/SW Contact:    Benard Halsted, LCSW Phone Number: 05/26/2020, 11:49 AM  Clinical Narrative:                 CSW spoke with patient's daughter, April, to discuss SNF recommendation. She is requesting for patient to return home with home health services instead. She confirmed patient's address and PCP. She stated that patient normally drives herself to dialysis and CSW made her aware that patient will need assistance once she comes home.   Expected Discharge Plan: [place] Barriers to Discharge: No Barriers Identified   Patient Goals and CMS Choice Patient states their goals for this hospitalization and ongoing recovery are:: Return home CMS Medicare.gov Compare Post Acute Care list provided to:: Patient Choice offered to / list presented to : [place]  Expected Discharge Plan and Services Expected Discharge Plan: [place]   Discharge Planning Services: CM Consult Post Acute Care Choice: [place] arrangements for the past 2 months: Single Family Home                                      Prior Living Arrangements/Services Living arrangements for the past 2 months: Single Family Home Lives with:: Adult Children Patient language and need for interpreter reviewed:: Yes Do you feel safe going back to the place where you live?: Yes      Need for Family Participation in Patient Care: No (Comment) Care giver support system in place?: Yes (comment)   Criminal Activity/Legal Involvement Pertinent to Current Situation/Hospitalization: No - Comment as needed  Activities of Daily Living Home Assistive Devices/Equipment: Blood pressure cuff,Eyeglasses,Dentures (specify type) ADL Screening (condition at time of admission) Patient's cognitive ability adequate to  safely complete daily activities?: Yes Is the patient deaf or have difficulty hearing?: No Does the patient have difficulty seeing, even when wearing glasses/contacts?: No Does the patient have difficulty concentrating, remembering, or making decisions?: No Patient able to express need for assistance with ADLs?: Yes Does the patient have difficulty dressing or bathing?: No Independently performs ADLs?: Yes (appropriate for developmental age) Does the patient have difficulty walking or climbing stairs?: No Weakness of Legs: Both Weakness of Arms/Hands: Both  Permission Sought/Granted Permission sought to share information with : Facility Contact Representative,Family Supports Permission granted to share information with : Yes, Verbal Permission Granted  Share Information with NAME: April     Permission granted to share info w Relationship: Daughter     Emotional Assessment       Orientation: : Oriented to Self,Oriented to Place,Oriented to  Time,Oriented to Situation Alcohol / Substance Use: Not Applicable Psych Involvement: No (comment)  Admission diagnosis:  Pneumonia [J18.9] Acute respiratory failure with hypoxia ([place]) [J96.01] Community acquired pneumonia, unspecified laterality [J18.9] Patient Active Problem List   Diagnosis Date Noted  . Pneumonia 05/23/2020  . COVID-19 virus infection 05/23/2020  . History of lung transplant ([place]) 05/23/2020  . ESRD (end stage renal disease) ([place]) 05/23/2020  . Anemia 05/23/2020   PCP:  Orpah Melter, MD Pharmacy:   [id number] [place] [place] [address]Phone: [phone]: [phone]    Social  Determinants of Health (SDOH) Interventions    Readmission Risk Interventions No flowsheet data found.

## 2020-05-26 NOTE — TOC Transition Note (Signed)
Transition of Care [facility]) - CM/SW Discharge Note   Patient Details  Name: Alexaundria Deol MRN: [id number] Date of Birth: [birth year]  [facility]) CM/SW Contact:  Benard Halsted, LCSW Phone Number: 05/26/2020, 1:41 PM   Clinical Narrative:    CSW made patient's daughter aware of patient's COVID dialysis time of MWF 6am. She reported she will be taking time off to care for patient and can transport her. CSW confirmed that patient will need a youth walker. Once that arrives, RN can call daughter for transport. Daughter aware that patient does not meet criteria for oxygen needs but she reported she may go to a medical supply store to buy oxygen if she needs to take patient to [place] in the future. No other needs identified.    Final next level of care: [place] Barriers to Discharge: No Barriers Identified   Patient Goals and CMS Choice Patient states their goals for this hospitalization and ongoing recovery are:: Return home CMS Medicare.gov Compare Post Acute Care list provided to:: Patient Choice offered to / list presented to : [place]  Discharge Placement                Patient to be transferred to facility by: Car Name of family member notified: April, daughter Patient and family notified of of transfer: 05/26/20  Discharge Plan and Services   Discharge Planning Services: CM Consult Post Acute Care Choice: Home Health          DME Arranged: [place] Rile rolling         HH Arranged: PT,OT [place] Agency: [place] Date [facility] Agency Contacted: 05/26/20 Time [place]: [id number]    Social Determinants of Health (SDOH) Interventions     Readmission Risk Interventions No flowsheet data found.

## 2020-05-26 NOTE — Care Management (Signed)
DME youth walker to be delivered to unit to go home w patient

## 2020-05-26 NOTE — Progress Notes (Signed)
SATURATION QUALIFICATIONS: (This note is used to comply with regulatory documentation for home oxygen)  Patient Saturations on Room Air at Rest = 90%  Patient Saturations on Room Air while Ambulating = 91%

## 2020-05-26 NOTE — Discharge Instructions (Signed)
Person Under Monitoring Name: Meagan Dawson  Location: [place] [address]   Infection Prevention Recommendations for Individuals Confirmed to have, or Being Evaluated for, 2019 Novel Coronavirus (COVID-19) Infection Who Receive Care at Home  Individuals who are confirmed to have, or are being evaluated for, COVID-19 should follow the prevention steps below until a healthcare provider or local or state health department says they can return to normal activities.  Stay home except to get medical care You should restrict activities outside your home, except for getting medical care. Do not go to work, school, or public areas, and do not use public transportation or taxis.  Call ahead before visiting your doctor Before your medical appointment, call the healthcare provider and tell them that you have, or are being evaluated for, COVID-19 infection. This will help the healthcare provider's office take steps to keep other people from getting infected. Ask your healthcare provider to call the local or state health department.  Monitor your symptoms Seek prompt medical attention if your illness is worsening (e.g., difficulty breathing). Before going to your medical appointment, call the healthcare provider and tell them that you have, or are being evaluated for, COVID-19 infection. Ask your healthcare provider to call the local or state health department.  Wear a facemask You should wear a facemask that covers your nose and mouth when you are in the same room with other people and when you visit a healthcare provider. People who live with or visit you should also wear a facemask while they are in the same room with you.  Separate yourself from other people in your home As much as possible, you should stay in a different room from other people in your home. Also, you should use a separate bathroom, if available.  Avoid sharing household items You should not share  dishes, drinking glasses, cups, eating utensils, towels, bedding, or other items with other people in your home. After using these items, you should wash them thoroughly with soap and water.  Cover your coughs and sneezes Cover your mouth and nose with a tissue when you cough or sneeze, or you can cough or sneeze into your sleeve. Throw used tissues in a lined trash can, and immediately wash your hands with soap and water for at least 20 seconds or use an alcohol-based hand rub.  Wash your [facility] your hands often and thoroughly with soap and water for at least 20 seconds. You can use an alcohol-based hand sanitizer if soap and water are not available and if your hands are not visibly dirty. Avoid touching your eyes, nose, and mouth with unwashed hands.   Prevention Steps for Caregivers and Household Members of Individuals Confirmed to have, or Being Evaluated for, COVID-19 Infection Being Cared for in the Home  If you live with, or provide care at home for, a person confirmed to have, or being evaluated for, COVID-19 infection please follow these guidelines to prevent infection:  Follow healthcare provider's instructions Make sure that you understand and can help the patient follow any healthcare provider instructions for all care.  Provide for the patient's basic needs You should help the patient with basic needs in the home and provide support for getting groceries, prescriptions, and other personal needs.  Monitor the patient's symptoms If they are getting sicker, call his or her medical provider and tell them that the patient has, or is being evaluated for, COVID-19 infection. This will help the healthcare provider's office  take steps to keep other people from getting infected. Ask the healthcare provider to call the local or state health department.  Limit the number of people who have contact with the patient  If possible, have only one caregiver for the patient.  Other  household members should stay in another home or place of residence. If this is not possible, they should stay  in another room, or be separated from the patient as much as possible. Use a separate bathroom, if available.  Restrict visitors who do not have an essential need to be in the home.  Keep older adults, very young children, and other sick people away from the patient Keep older adults, very young children, and those who have compromised immune systems or chronic health conditions away from the patient. This includes people with chronic heart, lung, or kidney conditions, diabetes, and cancer.  Ensure good ventilation Make sure that shared spaces in the home have good air flow, such as from an air conditioner or an opened window, weather permitting.  Wash your hands often  Wash your hands often and thoroughly with soap and water for at least 20 seconds. You can use an alcohol based hand sanitizer if soap and water are not available and if your hands are not visibly dirty.  Avoid touching your eyes, nose, and mouth with unwashed hands.  Use disposable paper towels to dry your hands. If not available, use dedicated cloth towels and replace them when they become wet.  Wear a facemask and gloves  Wear a disposable facemask at all times in the room and gloves when you touch or have contact with the patient's blood, body fluids, and/or secretions or excretions, such as sweat, saliva, sputum, nasal mucus, vomit, urine, or feces.  Ensure the mask fits over your nose and mouth tightly, and do not touch it during use.  Throw out disposable facemasks and gloves after using them. Do not reuse.  Wash your hands immediately after removing your facemask and gloves.  If your personal clothing becomes contaminated, carefully remove clothing and launder. Wash your hands after handling contaminated clothing.  Place all used disposable facemasks, gloves, and other waste in a lined container before  disposing them with other household waste.  Remove gloves and wash your hands immediately after handling these items.  Do not share dishes, glasses, or other household items with the patient  Avoid sharing household items. You should not share dishes, drinking glasses, cups, eating utensils, towels, bedding, or other items with a patient who is confirmed to have, or being evaluated for, COVID-19 infection.  After the person uses these items, you should wash them thoroughly with soap and water.  Wash laundry thoroughly  Immediately remove and wash clothes or bedding that have blood, body fluids, and/or secretions or excretions, such as sweat, saliva, sputum, nasal mucus, vomit, urine, or feces, on them.  Wear gloves when handling laundry from the patient.  Read and follow directions on labels of laundry or clothing items and detergent. In general, wash and dry with the warmest temperatures recommended on the label.  Clean all areas the individual has used often  Clean all touchable surfaces, such as counters, tabletops, doorknobs, bathroom fixtures, toilets, phones, keyboards, tablets, and bedside tables, every day. Also, clean any surfaces that may have blood, body fluids, and/or secretions or excretions on them.  Wear gloves when cleaning surfaces the patient has come in contact with.  Use a diluted bleach solution (e.g., dilute bleach with 1 part  bleach and 10 parts water) or a household disinfectant with a label that says EPA-registered for coronaviruses. To make a bleach solution at home, add 1 tablespoon of bleach to 1 quart (4 cups) of water. For a larger supply, add  cup of bleach to 1 gallon (16 cups) of water.  Read labels of cleaning products and follow recommendations provided on product labels. Labels contain instructions for safe and effective use of the cleaning product including precautions you should take when applying the product, such as wearing gloves or eye protection  and making sure you have good ventilation during use of the product.  Remove gloves and wash hands immediately after cleaning.  Monitor yourself for signs and symptoms of illness Caregivers and household members are considered close contacts, should monitor their health, and will be asked to limit movement outside of the home to the extent possible. Follow the monitoring steps for close contacts listed on the symptom monitoring form.   ? If you have additional questions, contact your local health department or call the epidemiologist on call at [phone] (available 24/7). ? This guidance is subject to change. For the most up-to-date guidance from [facility], please refer to their website: [email or web address]

## 2020-05-26 NOTE — Progress Notes (Signed)
[facility] Progress Note   Subjective:   Feeling ok today, no new issues.  Tolerated HD yesterday with no UF due to poor po intake.    Objective Vitals:   05/25/20 0737 05/25/20 1156 05/25/20 2330 05/26/20 0335  BP: 122/74 (!) 138/54 (!) 103/51 129/68  Pulse: 77 70 80 72  Resp: (!) 23 19 (!) 21 (!) 22  Temp: 97.6 F (36.4 C) (!) 97.4 F (36.3 C) 97.6 F (36.4 C)   TempSrc: Oral Oral Oral   SpO2: 93% 91% 92% 92%  Weight:      Height:       Physical Exam Gen: nontoxic elderly woman washing up at sink Eyes: anicteric ENT: MMM Neck: supple CV:  RRR, III/VI SEM Abd:  Soft, thin Lungs: normal WOB, rales in L base > R base, a few scattered rhonchi GU: no foley Extr:  LUE AVG +t/b Neuro: nonfocal Skin: no rashes  Additional Objective Labs: Basic Metabolic Panel: Recent Labs  Lab 05/24/20 0500 05/24/20 0559 05/25/20 0112 05/26/20 0049  NA 138  --  131* 135  K 3.6  --  4.0 3.4*  CL 100  --  97* 98  CO2 23  --  17* 22  GLUCOSE 156*  --  228* 148*  BUN 22  --  42* 24*  CREATININE 5.03*  --  6.20* 3.73*  CALCIUM 7.4*  --  7.5* 7.4*  PHOS  --  3.8  --   --    Liver Function Tests: Recent Labs  Lab 05/24/20 0500 05/25/20 0112 05/26/20 0049  AST '19 19 20  '$ ALT '10 11 7  '$ ALKPHOS 46 44 51  BILITOT 0.9 0.8 0.5  PROT 5.0* 4.7* 4.7*  ALBUMIN 2.5* 2.4* 2.4*   No results for input(s): LIPASE, AMYLASE in the last 168 hours. CBC: Recent Labs  Lab 05/23/20 1317 05/24/20 0500 05/24/20 2222 05/25/20 0112 05/26/20 0049  WBC 6.9 4.8  --  3.4* 6.2  NEUTROABS 5.9 4.6  --  2.6 5.0  HGB 10.0* 8.8* 8.6* 8.6* 9.0*  HCT 31.0* 29.3* 28.1* 27.5* 28.6*  MCV 107.6* 111.8*  --  107.8* 106.7*  PLT 261 242  --  256 266   Blood Culture    Component Value Date/Time   SDES BLOOD SITE NOT SPECIFIED 05/23/2020 1945   SPECREQUEST  05/23/2020 1945    BOTTLES DRAWN AEROBIC AND ANAEROBIC Blood Culture adequate volume   CULT  05/23/2020 1945    NO GROWTH 2  DAYS Performed at [facility], [place] [address], [place], [place] [address]    REPTSTATUS PENDING 05/23/2020 1945    Cardiac Enzymes: No results for input(s): CKTOTAL, CKMB, CKMBINDEX, TROPONINI in the last 168 hours. CBG: Recent Labs  Lab 05/25/20 1158 05/25/20 1816 05/25/20 2226 05/26/20 0726 05/26/20 0811  GLUCAP 300* 161* 275* 52* 122*   Iron Studies:  Recent Labs    05/23/20 1701  FERRITIN 2,012*   '@lablastinr3'$ @ Studies/Results: ECHOCARDIOGRAM COMPLETE  Result Date: 05/25/2020    ECHOCARDIOGRAM REPORT   Patient Name:   RYKIA FINNIN Date of Exam: 05/25/2020 Medical Rec #:  [id number]    Height:       59.0 in Accession #:    [id number]   Weight:       75.0 lb Date of Birth:  03-07-1951    BSA:          1.216 m Patient Age:    32 years     BP:  138/54 mmHg Patient Gender: F            HR:           65 bpm. Exam Location:  Inpatient Procedure: 2D Echo, Cardiac Doppler and Color Doppler Indications:    CHF-Acute Diastolic  History:        Patient has no prior history of Echocardiogram examinations.                 Risk Factors:Former Smoker. COVID-19. History of lung                 transplant. ESRD.  Sonographer:    Clayton Lefort RDCS (AE) Referring Phys: [id number] [place]  1. Left ventricular ejection fraction, by estimation, is 60 to 65%. The left ventricle has normal function. The left ventricle has no regional wall motion abnormalities. Left ventricular diastolic parameters are indeterminate.  2. Right ventricular systolic function is normal. The right ventricular size is normal. There is normal pulmonary artery systolic pressure.  3. The mitral valve is degenerative. Mild mitral valve regurgitation. No evidence of mitral stenosis. Moderate mitral annular calcification.  4. The aortic valve is tricuspid. Aortic valve regurgitation is trivial. Mild to moderate aortic valve sclerosis/calcification is present, without any evidence of aortic stenosis.  5. The  inferior vena cava is normal in size with greater than 50% respiratory variability, suggesting right atrial pressure of 3 mmHg. FINDINGS  Left Ventricle: Left ventricular ejection fraction, by estimation, is 60 to 65%. The left ventricle has normal function. The left ventricle has no regional wall motion abnormalities. The left ventricular internal cavity size was normal in size. There is  no left ventricular hypertrophy. Left ventricular diastolic parameters are indeterminate. Right Ventricle: The right ventricular size is normal. No increase in right ventricular wall thickness. Right ventricular systolic function is normal. There is normal pulmonary artery systolic pressure. The tricuspid regurgitant velocity is 2.25 m/s, and  with an assumed right atrial pressure of 3 mmHg, the estimated right ventricular systolic pressure is 123XX123 mmHg. Left Atrium: Left atrial size was normal in size. Right Atrium: Right atrial size was normal in size. Pericardium: There is no evidence of pericardial effusion. Mitral Valve: The mitral valve is degenerative in appearance. There is severe thickening of the mitral valve leaflet(s). There is severe calcification of the mitral valve leaflet(s). Moderate mitral annular calcification. Mild mitral valve regurgitation.  No evidence of mitral valve stenosis. Tricuspid Valve: The tricuspid valve is normal in structure. Tricuspid valve regurgitation is mild . No evidence of tricuspid stenosis. Aortic Valve: The aortic valve is tricuspid. Aortic valve regurgitation is trivial. Aortic regurgitation PHT measures 879 msec. Mild to moderate aortic valve sclerosis/calcification is present, without any evidence of aortic stenosis. Aortic valve mean gradient measures 3.0 mmHg. Aortic valve peak gradient measures 8.0 mmHg. Aortic valve area, by VTI measures 1.61 cm. Pulmonic Valve: The pulmonic valve was normal in structure. Pulmonic valve regurgitation is not visualized. No evidence of pulmonic  stenosis. Aorta: The aortic root is normal in size and structure. Venous: The inferior vena cava is normal in size with greater than 50% respiratory variability, suggesting right atrial pressure of 3 mmHg. IAS/Shunts: The interatrial septum appears to be lipomatous. No atrial level shunt detected by color flow Doppler.  LEFT VENTRICLE PLAX 2D LVIDd:         4.10 cm  Diastology LVIDs:         2.40 cm  LV e' medial:    6.85  cm/s LV PW:         1.50 cm  LV E/e' medial:  25.0 LV IVS:        1.10 cm  LV e' lateral:   7.29 cm/s LVOT diam:     1.70 cm  LV E/e' lateral: 23.5 LV SV:         49 LV SV Index:   40 LVOT Area:     2.27 cm  RIGHT VENTRICLE             IVC RV Basal diam:  2.80 cm     IVC diam: 0.80 cm RV S prime:     11.60 cm/s TAPSE (M-mode): 2.4 cm LEFT ATRIUM           Index       RIGHT ATRIUM           Index LA diam:      3.50 cm 2.88 cm/m  RA Area:     12.70 cm LA Vol (A2C): 52.0 ml 42.78 ml/m RA Volume:   25.70 ml  21.14 ml/m LA Vol (A4C): 49.9 ml 41.05 ml/m  AORTIC VALVE AV Area (Vmax):    1.61 cm AV Area (Vmean):   1.54 cm AV Area (VTI):     1.61 cm AV Vmax:           141.00 cm/s AV Vmean:          81.000 cm/s AV VTI:            0.304 m AV Peak Grad:      8.0 mmHg AV Mean Grad:      3.0 mmHg LVOT Vmax:         99.80 cm/s LVOT Vmean:        54.800 cm/s LVOT VTI:          0.215 m LVOT/AV VTI ratio: 0.71 AI PHT:            879 msec  AORTA Ao Root diam: 3.10 cm Ao Asc diam:  2.80 cm MITRAL VALVE                TRICUSPID VALVE MV Area (PHT): 3.21 cm     TR Peak grad:   20.2 mmHg MV Decel Time: 236 msec     TR Vmax:        225.00 cm/s MV E velocity: 171.00 cm/s MV A velocity: 56.00 cm/s   SHUNTS MV E/A ratio:  3.05         Systemic VTI:  0.22 m                             Systemic Diam: 1.70 cm Jenkins Rouge MD Electronically signed by Jenkins Rouge MD Signature Date/Time: 05/25/2020/1:24:43 PM    Final    VAS [place] LOWER EXTREMITY VENOUS (DVT)  Result Date: 05/24/2020  Lower Venous DVT Study Indications:  D-Dimer. Other Indications: Covid. Risk Factors: Surgery hx Lung transplant. Comparison Study: No previous exams Performing Technologist: Vonzell Schlatter RVT  Examination Guidelines: A complete evaluation includes B-mode imaging, spectral Doppler, color Doppler, and power Doppler as needed of all accessible portions of each vessel. Bilateral testing is considered an integral part of a complete examination. Limited examinations for reoccurring indications may be performed as noted. The reflux portion of the exam is performed with the patient in reverse Trendelenburg.  +---------+---------------+---------+-----------+----------+--------------+ RIGHT    CompressibilityPhasicitySpontaneityPropertiesThrombus Aging +---------+---------------+---------+-----------+----------+--------------+ CFV  Full           Yes      Yes                                 +---------+---------------+---------+-----------+----------+--------------+ SFJ      Full                                                        +---------+---------------+---------+-----------+----------+--------------+ FV Prox  Full                                                        +---------+---------------+---------+-----------+----------+--------------+ FV Mid   Full                                                        +---------+---------------+---------+-----------+----------+--------------+ FV DistalFull                                                        +---------+---------------+---------+-----------+----------+--------------+ PFV      Full                                                        +---------+---------------+---------+-----------+----------+--------------+ POP      Full           Yes      Yes                                 +---------+---------------+---------+-----------+----------+--------------+ PTV      Full                                                         +---------+---------------+---------+-----------+----------+--------------+ PERO     Full                                                        +---------+---------------+---------+-----------+----------+--------------+   +---------+---------------+---------+-----------+----------+--------------+ LEFT     CompressibilityPhasicitySpontaneityPropertiesThrombus Aging +---------+---------------+---------+-----------+----------+--------------+ CFV      Full           Yes      Yes                                 +---------+---------------+---------+-----------+----------+--------------+  SFJ      Full                                                        +---------+---------------+---------+-----------+----------+--------------+ FV Prox  Full                                                        +---------+---------------+---------+-----------+----------+--------------+ FV Mid   Full                                                        +---------+---------------+---------+-----------+----------+--------------+ FV DistalFull                                                        +---------+---------------+---------+-----------+----------+--------------+ PFV      Full                                                        +---------+---------------+---------+-----------+----------+--------------+ POP      Full           Yes      Yes                                 +---------+---------------+---------+-----------+----------+--------------+ PTV      Full                                                        +---------+---------------+---------+-----------+----------+--------------+ PERO     Full                                                        +---------+---------------+---------+-----------+----------+--------------+  Summary: BILATERAL: - No evidence of deep vein thrombosis seen in the lower extremities, bilaterally. -No evidence of  popliteal cyst, bilaterally.   *See table(s) above for measurements and observations. Electronically signed by Deitra Mayo MD on 05/24/2020 at 11:26:15 AM.    Final    Medications: . ceFEPime (MAXIPIME) IV 1 g (05/25/20 2124)  . remdesivir 100 mg in NS 100 mL 100 mg (05/25/20 1057)   . acyclovir  400 mg Oral BID  . vitamin C  500 mg Oral Daily  . aspirin EC  81 mg Oral Daily  . atorvastatin  10 mg Oral Daily  . azithromycin  250 mg  Oral Once per day on Mon Wed Fri  . Chlorhexidine Gluconate Cloth  6 each Topical Q0600  . cholecalciferol  1,000 Units Oral Daily  . feeding supplement (NEPRO CARB STEADY)  237 mL Oral TID BM  . guaiFENesin  600 mg Oral BID  . heparin injection (subcutaneous)  5,000 Units Subcutaneous Q8H  . insulin aspart  0-5 Units Subcutaneous QHS  . insulin aspart  0-9 Units Subcutaneous TID WC  . insulin detemir  6 Units Subcutaneous BID  . metroNIDAZOLE  500 mg Oral Q8H  . midodrine  10 mg Oral Q M,W,F  . mycophenolate  250 mg Oral BID  . pantoprazole (PROTONIX) IV  40 mg Intravenous Q12H  . predniSONE  40 mg Oral Q breakfast  . sulfamethoxazole-trimethoprim  1 tablet Oral Once per day on Mon Thu  . tacrolimus  1 mg Oral Q12H  . zinc sulfate  220 mg Oral Daily    Assessment/Plan **Hypoxic respiratory failure: COVID 19 +, s/p lung transplant, being treated for bacterial PNA as well; pulm consulted.  [facility] aware she's here.   **ESRD on HD:  MWF [facility]; tolerated HD fine 1/26 per usual schedule.   Midodrine prior. No UF today.  **S/p lung transplant: on immunosuppression, pulmonary has seen.   **DM: insulin per primary, high with the steroids.  **Anemia:  Hb 9, monitor, hold IV iron in setting of infection.   **BMM: corr ca ok, check phos  Dispo - primary plans to send her home today, ok w me   Jannifer Hick MD 05/26/2020, 9:17 AM  [facility] Pager: [phone]

## 2020-05-26 NOTE — Progress Notes (Signed)
Physical Therapy Treatment Patient Details Name: Meagan Dawson MRN: [id number] DOB: 1951-01-20 Today's Date: 05/26/2020    History of Present Illness 70 yo female with onset of recent Covid+ test was noted to have fever, cough and fatigue, hypoxia.  PMHx:  B lung transplant 2016, COPD, HD, HLD, DM    PT Comments    Pt reports feeling much better today and is able to maintain SaO2 88-92%O2 on RA throughout session. Pt is limited in safe mobility by decreased strength and endurance. Pt is min guard for bed mobility and transfers and contact guard for safety with ambulation of 80 ft using RW. Pt's daughter request pt come home with HHPT, pt will need supervision with mobility for safety. PT will continue to follow acutely.   Follow Up Recommendations  Home health PT;Supervision/Assistance - 24 hour     Equipment Recommendations  Rolling walker with 5" wheels ([employer] walker)       Precautions / Restrictions Precautions Precautions: Fall Precaution Comments: monitor O2 sats Restrictions Weight Bearing Restrictions: No    Mobility  Bed Mobility Overal bed mobility: Needs Assistance Bed Mobility: Supine to Sit;Sit to Supine     Supine to sit: Min guard     General bed mobility comments: increased time and effort with HOB elevated  Transfers Overall transfer level: Needs assistance Equipment used: Rolling walker (2 wheeled) Transfers: Sit to/from [facility] Sit to Stand: Min guard         General transfer comment: min guard for safety, vc for hand placement,  Ambulation/Gait Ambulation/Gait assistance: Min assist Gait Distance (Feet): 80 Feet Assistive device: Rolling walker (2 wheeled) Gait Pattern/deviations: Step-through pattern Gait velocity: decreased Gait velocity interpretation: <1.31 ft/sec, indicative of household ambulator General Gait Details: contact guard assist for safety with ambulation in hallway, vc for proximity to RW          Balance Overall balance assessment: Needs assistance Sitting-balance support: Feet supported Sitting balance-Leahy Scale: Fair     Standing balance support: Single extremity supported Standing balance-Leahy Scale: Fair Standing balance comment: Pt requires UE support                            Cognition Arousal/Alertness: Awake/alert Behavior During Therapy: WFL for tasks assessed/performed Overall Cognitive Status: Within Functional Limits for tasks assessed                                           General Comments General comments (skin integrity, edema, etc.): pt SaO2 on RA at rest 88%O2, with ambulation 90%O2 on RA      Pertinent Vitals/Pain Pain Assessment: No/denies pain           PT Goals (current goals can now be found in the care plan section) Acute Rehab PT Goals Patient Stated Goal: to get stronger PT Goal Formulation: With patient Time For Goal Achievement: 06/07/20 Potential to Achieve Goals: Good Progress towards PT goals: Progressing toward goals    Frequency    Min 3X/week      PT Plan Discharge plan needs to be updated       AM-PAC PT "6 Clicks" Mobility   Outcome Measure  Help needed turning from your back to your side while in a flat bed without using bedrails?: A Little Help needed moving from lying on your back to sitting on  the side of a flat bed without using bedrails?: A Little Help needed moving to and from a bed to a chair (including a wheelchair)?: A Little Help needed standing up from a chair using your arms (e.g., wheelchair or bedside chair)?: A Little Help needed to walk in hospital room?: A Little Help needed climbing 3-5 steps with a railing? : A Lot 6 Click Score: 17    End of Session Equipment Utilized During Treatment: Gait belt Activity Tolerance: Patient tolerated treatment well Patient left: in chair;with chair alarm set Nurse Communication: Mobility status PT Visit Diagnosis:  Unsteadiness on feet (R26.81);Muscle weakness (generalized) (M62.81);Difficulty in walking, not elsewhere classified (R26.2)     Time: [id number] PT Time Calculation (min) (ACUTE ONLY): 22 min  Charges:  $Gait Training: 8-22 mins                     Tamsen Reist B. Migdalia Dk PT, DPT Acute Rehabilitation Services Pager [phone] Office [phone]    [place] 05/26/2020, 12:56 PM

## 2020-05-26 NOTE — Progress Notes (Signed)
Patient was discharged home by MD order; discharged instructions review and give to patient with care; IV DIC; medicine were delivered to her room by [facility]; the walker was delivered to her room; patient will be escorted to the car by nurse tech via wheelchair.

## 2020-05-26 NOTE — Plan of Care (Signed)
Patient adequate for discharge.

## 2020-05-26 NOTE — Discharge Summary (Addendum)
PATIENT DETAILS Name: Meagan Dawson Age: 70 y.o. Sex: female Date of Birth: [birth year] MRN: [id number]. Admitting Physician: Shela Leff, MD [id number], Annie Main, MD  Admit Date: 05/23/2020 Discharge date: 05/26/2020  Recommendations for Outpatient Follow-up:  1. Follow up with PCP in 1-2 weeks 2. Please obtain CMP/CBC in one week 3. Repeat Chest Xray in 4-6 week  Admitted From:  Home  Disposition: Home with home health services (patient/family prefers home rather than SNF)   Home Health: Yes  Equipment/Devices: None  Discharge Condition: Stable  CODE STATUS: FULL CODE  Diet recommendation:  Diet Order            Diet - low sodium heart healthy           Diet Carb Modified           Diet Carb Modified Fluid consistency: Thin; Room service appropriate? Yes  Diet effective now                   Brief Narrative: Patient is a 70 y.o. 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  Per patient she was not able to walk after her HD yesterday.  Upon further evaluation in the emergency room-patient was found to have acute hypoxic respiratory failure due to COVID-19 and possible bacterial pneumonia.  See below for further details.  COVID-19 vaccinated status: Vaccinated-including booster  Significant Events: 1/24>> Admit to [facility] for hypoxia/generalized weakness due to COVID/bacterial pneumonia.  Significant studies: 1/24>> CTA chest: No PE, left > right pneumonia-mucus in the right lower lobe bronchi.   1/25>> bilateral lower extremity Doppler: No DVT 1/26>> Echo: EF 60-65%   COVID-19 medications: Monoclonal antibody infusion on 1/13 Steroids: 1/24>> Remdesivir:  1/24>>1/27  Antibiotics: Cefepime: 1/24>>1/7 Flagyl: 1/24>>  Microbiology data: 1/24>>Blood culture:pending 1/24>> respiratory virus panel: Negative  Procedures: None  Consults: PCCM  Brief Hospital Course: Acute Hypoxic Resp Failure due to healthcare associated pneumonia and possible COVID-19 pneumonia: Hypoxia has resolved-suspect this is mostly a bacterial pneumonia-but given her immunocompromised status-differential is broad-she apparently has a history of aspergillosis as well.  Discussed with transplant service at [facility] with PCCM here at [facility] was empirically started on cefepime/Flagyl for bacterial pneumonia-and steroid/Remdesivir.  Significantly improved-discussed with Dr. Gailen Shelter team-at [facility] on 1/27-recommends levofloxacin/Flagyl on discharge to complete a total of 7 days of antimicrobial therapy.  Does not recommend continuing Remdesivir for more than 3 days.  Will taper steroids to usual home regimen.   Ambulated-does not require O2 with ambulation or at rest.  COVID-19 Labs:  Recent Labs    05/23/20 1701 05/24/20 0500 05/25/20 0112 05/26/20 0049  DDIMER 1.33* 1.29* 0.87* 1.01*  FERRITIN 2,012*  --   --   --   LDH 240*  --   --   --   CRP 4.6* 7.3* 6.4* 4.4*    Lab Results  Component Value Date   SARSCOV2NAA POSITIVE (A) 05/23/2020     Minimal troponin elevation: Probably due to COVID-19 related inflammation-CTA chest/lower extremity Doppler negative-since hypoxia has improved-doubt further work-up required.  Bilateral lung transplantation: Case was discussed on a daily basis with [facility] transplant team-Dr.Zaffiri- (pager [phone]) over the phone-C recommendations for discharge antibiotics.  Continue prophylactic acyclovir, Bactrim-prophylactic Zithromax to restart after patient finishes a course of levofloxacin.  Plans were to transfer to [facility] no beds available.  Since clinically improved-patient being discharged home-she will  have follow-up at [facility] next week.   ESRD: On HD MWF-nephrology followed closely-nephrology aware of discharge plans.  Hypomagnesemia: Repleted  Normocytic anemia: Due to CKD-follow hemoglobin closely.  DM-2 (A1c 6.7 on 1/25) with uncontrolled hyperglycemia due to steroids: CB much improved-continue diet/lifestyle modification-follow with PCP.  Briefly required Levemir/NovoLog during this hospital stay.    Mildly prolonged QTC:  Stable-we will replete potassium before discharge.  Debility/deconditioning: Secondary to acute illness-improved-family/patient prefers going home with home health services rather than SNF.  Daughter to provide 24/7 care.   Nutrition Problem: Nutrition Problem: Increased nutrient needs Etiology: chronic illness (ESRD HD) Signs/Symptoms: estimated needs Interventions: Nepro shake   Discharge Diagnoses:  Principal Problem:   Pneumonia Active Problems:   COVID-19 virus infection   History of lung transplant ([place])   ESRD (end stage renal disease) ([place])   Anemia   Discharge Instructions:    Person Under Monitoring Name: Meagan Dawson  Location: [address] [place] [address]   Infection Prevention Recommendations for Individuals Confirmed to have, or Being Evaluated for, 2019 Novel Coronavirus (COVID-19) Infection Who Receive Care at Home  Individuals who are confirmed to have, or are being evaluated for, COVID-19 should follow the prevention steps below until a healthcare provider or local or state health department says they can return to normal activities.  Stay home except to get medical care You should restrict activities outside your home, except for getting medical care. Do not go to work, school, or public areas, and do not use public transportation or taxis.  Call ahead before visiting your doctor Before your medical appointment, call the healthcare provider and tell them that you have, or are being evaluated for, COVID-19  infection. This will help the healthcare provider's office take steps to keep other people from getting infected. Ask your healthcare provider to call the local or state health department.  Monitor your symptoms Seek prompt medical attention if your illness is worsening (e.g., difficulty breathing). Before going to your medical appointment, call the healthcare provider and tell them that you have, or are being evaluated for, COVID-19 infection. Ask your healthcare provider to call the local or state health department.  Wear a facemask You should wear a facemask that covers your nose and mouth when you are in the same room with other people and when you visit a healthcare provider. People who live with or visit you should also wear a facemask while they are in the same room with you.  Separate yourself from other people in your home As much as possible, you should stay in a different room from other people in your home. Also, you should use a separate bathroom, if available.  Avoid sharing household items You should not share dishes, drinking glasses, cups, eating utensils, towels, bedding, or other items with other people in your home. After using these items, you should wash them thoroughly with soap and water.  Cover your coughs and sneezes Cover your mouth and nose with a tissue when you cough or sneeze, or you can cough or sneeze into your sleeve. Throw used tissues in a lined trash can, and immediately wash your hands with soap and water for at least 20 seconds or use an alcohol-based hand rub.  Wash your [facility] your hands often and thoroughly with soap and water for at least 20 seconds. You can use an alcohol-based hand sanitizer  if soap and water are not available and if your hands are not visibly dirty. Avoid touching your eyes, nose, and mouth with unwashed hands.   Prevention Steps for Caregivers and Household Members of Individuals Confirmed to have, or Being Evaluated  for, COVID-19 Infection Being Cared for in the Home  If you live with, or provide care at home for, a person confirmed to have, or being evaluated for, COVID-19 infection please follow these guidelines to prevent infection:  Follow healthcare provider's instructions Make sure that you understand and can help the patient follow any healthcare provider instructions for all care.  Provide for the patient's basic needs You should help the patient with basic needs in the home and provide support for getting groceries, prescriptions, and other personal needs.  Monitor the patient's symptoms If they are getting sicker, call his or her medical provider and tell them that the patient has, or is being evaluated for, COVID-19 infection. This will help the healthcare provider's office take steps to keep other people from getting infected. Ask the healthcare provider to call the local or state health department.  Limit the number of people who have contact with the patient  If possible, have only one caregiver for the patient.  Other household members should stay in another home or place of residence. If this is not possible, they should stay  in another room, or be separated from the patient as much as possible. Use a separate bathroom, if available.  Restrict visitors who do not have an essential need to be in the home.  Keep older adults, very young children, and other sick people away from the patient Keep older adults, very young children, and those who have compromised immune systems or chronic health conditions away from the patient. This includes people with chronic heart, lung, or kidney conditions, diabetes, and cancer.  Ensure good ventilation Make sure that shared spaces in the home have good air flow, such as from an air conditioner or an opened window, weather permitting.  Wash your hands often  Wash your hands often and thoroughly with soap and water for at least 20 seconds. You  can use an alcohol based hand sanitizer if soap and water are not available and if your hands are not visibly dirty.  Avoid touching your eyes, nose, and mouth with unwashed hands.  Use disposable paper towels to dry your hands. If not available, use dedicated cloth towels and replace them when they become wet.  Wear a facemask and gloves  Wear a disposable facemask at all times in the room and gloves when you touch or have contact with the patient's blood, body fluids, and/or secretions or excretions, such as sweat, saliva, sputum, nasal mucus, vomit, urine, or feces.  Ensure the mask fits over your nose and mouth tightly, and do not touch it during use.  Throw out disposable facemasks and gloves after using them. Do not reuse.  Wash your hands immediately after removing your facemask and gloves.  If your personal clothing becomes contaminated, carefully remove clothing and launder. Wash your hands after handling contaminated clothing.  Place all used disposable facemasks, gloves, and other waste in a lined container before disposing them with other household waste.  Remove gloves and wash your hands immediately after handling these items.  Do not share dishes, glasses, or other household items with the patient  Avoid sharing household items. You should not share dishes, drinking glasses, cups, eating utensils, towels, bedding, or other items with a  patient who is confirmed to have, or being evaluated for, COVID-19 infection.  After the person uses these items, you should wash them thoroughly with soap and water.  Wash laundry thoroughly  Immediately remove and wash clothes or bedding that have blood, body fluids, and/or secretions or excretions, such as sweat, saliva, sputum, nasal mucus, vomit, urine, or feces, on them.  Wear gloves when handling laundry from the patient.  Read and follow directions on labels of laundry or clothing items and detergent. In general, wash and dry with  the warmest temperatures recommended on the label.  Clean all areas the individual has used often  Clean all touchable surfaces, such as counters, tabletops, doorknobs, bathroom fixtures, toilets, phones, keyboards, tablets, and bedside tables, every day. Also, clean any surfaces that may have blood, body fluids, and/or secretions or excretions on them.  Wear gloves when cleaning surfaces the patient has come in contact with.  Use a diluted bleach solution (e.g., dilute bleach with 1 part bleach and 10 parts water) or a household disinfectant with a label that says EPA-registered for coronaviruses. To make a bleach solution at home, add 1 tablespoon of bleach to 1 quart (4 cups) of water. For a larger supply, add  cup of bleach to 1 gallon (16 cups) of water.  Read labels of cleaning products and follow recommendations provided on product labels. Labels contain instructions for safe and effective use of the cleaning product including precautions you should take when applying the product, such as wearing gloves or eye protection and making sure you have good ventilation during use of the product.  Remove gloves and wash hands immediately after cleaning.  Monitor yourself for signs and symptoms of illness Caregivers and household members are considered close contacts, should monitor their health, and will be asked to limit movement outside of the home to the extent possible. Follow the monitoring steps for close contacts listed on the symptom monitoring form.   ? If you have additional questions, contact your local health department or call the epidemiologist on call at [phone] (available 24/7). ? This guidance is subject to change. For the most up-to-date guidance from CDC, please refer to their website: YouBlogs.pl    Activity:  As tolerated  Discharge Instructions    Call MD for:  difficulty breathing, headache or  visual disturbances   Complete by: As directed    Diet - low sodium heart healthy   Complete by: As directed    Diet Carb Modified   Complete by: As directed    Discharge instructions   Complete by: As directed    1.)  21 days of isolation from 1/12  2.)  If you develop worsening shortness of breath please seek immediate medical attention  3.)  Please call [facility] lung transplant clinic and get a follow-up appointment in 1 week   Follow with Primary MD  Orpah Melter, MD in 1-2 weeks  Follow with lung transplant clinic at [facility] in 1 week  Please get a complete blood count and chemistry panel checked by your Primary MD at your next visit, and again as instructed by your Primary MD.  Get Medicines reviewed and adjusted: Please take all your medications with you for your next visit with your Primary MD  Laboratory/radiological data: Please request your Primary MD to go over all hospital tests and procedure/radiological results at the follow up, please ask your Primary MD to get all Hospital records sent to his/her office.  In some cases, they will  be blood work, cultures and biopsy results pending at the time of your discharge. Please request that your primary care M.D. follows up on these results.  Also Note the following: If you experience worsening of your admission symptoms, develop shortness of breath, life threatening emergency, suicidal or homicidal thoughts you must seek medical attention immediately by calling 911 or calling your MD immediately  if symptoms less severe.  You must read complete instructions/literature along with all the possible adverse reactions/side effects for all the Medicines you take and that have been prescribed to you. Take any new Medicines after you have completely understood and accpet all the possible adverse reactions/side effects.   Do not drive when taking Pain medications or sleeping medications (Benzodaizepines)  Do not take more than prescribed  Pain, Sleep and Anxiety Medications. It is not advisable to combine anxiety,sleep and pain medications without talking with your primary care practitioner  Special Instructions: If you have smoked or chewed Tobacco  in the last 2 yrs please stop smoking, stop any regular Alcohol  and or any Recreational drug use.  Wear Seat belts while driving.  Please note: You were cared for by a hospitalist during your hospital stay. Once you are discharged, your primary care physician will handle any further medical issues. Please note that NO REFILLS for any discharge medications will be authorized once you are discharged, as it is imperative that you return to your primary care physician (or establish a relationship with a primary care physician if you do not have one) for your post hospital discharge needs so that they can reassess your need for medications and monitor your lab values.   Increase activity slowly   Complete by: As directed      Allergies as of 05/26/2020      Reactions   Amoxicillin Rash   Arms and neck were red and swollen      Medication List    STOP taking these medications   doxycycline 100 MG capsule Commonly known as: VIBRAMYCIN   mupirocin cream 2 % Commonly known as: BACTROBAN     TAKE these medications   acetaminophen 325 MG tablet Commonly known as: TYLENOL Take 650 mg by mouth every 6 (six) hours as needed for moderate pain or headache.   acyclovir 400 MG tablet Commonly known as: ZOVIRAX Take 400 mg by mouth 2 (two) times daily.   aspirin 81 MG EC tablet Take 81 mg by mouth daily.   atorvastatin 10 MG tablet Commonly known as: LIPITOR Take 10 mg by mouth daily.   azithromycin 250 MG tablet Commonly known as: ZITHROMAX Take 1 tablet (250 mg total) by mouth 3 (three) times a week. Start  on 2/1 Start taking on: May 31, 2020 What changed:   additional instructions  These instructions start on May 31, 2020. If you are unsure what to do until  then, ask your doctor or other care provider.   CALCIUM 600 PO Take 600 mg by mouth daily.   calcium acetate 667 MG capsule Commonly known as: PHOSLO Take 1,334 mg by mouth 3 (three) times daily.   feeding supplement (NEPRO CARB STEADY) Liqd Take 237 mLs by mouth 3 (three) times daily between meals.   FiberCon 625 MG tablet Generic drug: polycarbophil Take 625 mg by mouth daily.   guaiFENesin 600 MG 12 hr tablet Commonly known as: MUCINEX Take 1 tablet (600 mg total) by mouth 2 (two) times daily for 7 days.   HYDROcodone-acetaminophen 5-325 MG tablet Commonly  known as: Norco Take 1 tablet by mouth every 6 (six) hours as needed. What changed: reasons to take this   levofloxacin 500 MG tablet Commonly known as: Levaquin Take 1 tablet (500 mg total) by mouth every other day for 4 days. Start taking on: May 27, 2020   loperamide 2 MG tablet Commonly known as: IMODIUM A-D Take 2 mg by mouth 4 (four) times daily as needed for diarrhea or loose stools.   Melatonin 3 MG Caps Take 3 mg by mouth at bedtime as needed (sleep).   metoprolol tartrate 25 MG tablet Commonly known as: LOPRESSOR Take 25 mg by mouth 2 (two) times daily.   metroNIDAZOLE 500 MG tablet Commonly known as: Flagyl Take 1 tablet (500 mg total) by mouth 3 (three) times daily for 3 days.   midodrine 10 MG tablet Commonly known as: PROAMATINE Take 10 mg by mouth every Monday, Wednesday, and Friday.   mycophenolate 250 MG capsule Commonly known as: CELLCEPT Take 250 mg by mouth 2 (two) times daily.   predniSONE 5 MG tablet Commonly known as: DELTASONE Take 5 mg by mouth daily with breakfast.   sodium bicarbonate 650 MG tablet Take 325 mg by mouth 2 (two) times daily.   sulfamethoxazole-trimethoprim 400-80 MG tablet Commonly known as: BACTRIM Take 1 tablet by mouth 2 (two) times a week. On Monday and Thursday   tacrolimus 1 MG capsule Commonly known as: PROGRAF Take 1 mg by mouth every 12  (twelve) hours.   Vitamin D (Cholecalciferol) 25 MCG (1000 UT) Tabs Take 25 mcg by mouth daily.            Durable Medical Equipment  (From admission, onward)         Start     Ordered   05/26/20 1350  For home use only DME Walker youth  Once       Question:  Patient needs a walker to treat with the following condition  Answer:  Weakness   05/26/20 1349          Follow-up Information    [place], [place] Follow up.   Specialty: Home Health Services Why: For home health services. They will call you in the next 1-2 days to set up a time to come to your house for Physical Therapy.  Contact information: [place] [address] [phone]        Orpah Melter, MD. Schedule an appointment as soon as possible for a visit in 1 week(s).   Specialty: Family Medicine Contact information: [place] [address] [phone]        Lung transplant clinic at [facility]. Schedule an appointment as soon as possible for a visit in 1 week(s).        [place]. Go to.   Why: Mondays, Wednesdays, Fridays on COVID shift at [place] information: [address], [place], [place] [address]             Allergies  Allergen Reactions  . Amoxicillin Rash    Arms and neck were red and swollen     Other Procedures/Studies: DG Chest 2 View  Result Date: 05/23/2020 CLINICAL DATA:  Shortness of breath over the past 2 weeks. EXAM: CHEST - 2 VIEW COMPARISON:  01/18/2020 FINDINGS: Stable enlarged cardiac silhouette and postsurgical changes. The lungs are hyperexpanded with mild prominence of the interstitial markings. Significantly improved bilateral airspace opacity. Minimal bilateral pleural thickening or  fluid with significant improvement. IMPRESSION: 1. Significantly improved bilateral pneumonia/edema with minimal residual bilateral pleural thickening or fluid. 2. Stable cardiomegaly and changes of  COPD. Electronically Signed   By: Claudie Revering M.D.   On: 05/23/2020 13:37   CT Angio Chest PE W and/or Wo Contrast  Result Date: 05/23/2020 CLINICAL DATA:  70 year old female with chest pain and shortness of breath. EXAM: CT ANGIOGRAPHY CHEST WITH CONTRAST TECHNIQUE: Multidetector CT imaging of the chest was performed using the standard protocol during bolus administration of intravenous contrast. Multiplanar CT image reconstructions and MIPs were obtained to evaluate the vascular anatomy. CONTRAST:  43m OMNIPAQUE IOHEXOL 350 MG/ML SOLN COMPARISON:  Chest CT dated 01/15/2020. FINDINGS: Cardiovascular: There is mild cardiomegaly. No pericardial effusion. Mild atherosclerotic calcification of the thoracic aorta. No pulmonary artery embolus identified. Mediastinum/Nodes: No definite hilar adenopathy. Multiple surgical clips noted in the mediastinum. The esophagus is grossly unremarkable. There is retained ingested matter versus mucous secretion within the esophagus. No mediastinal fluid collection. Lungs/Pleura: Bilateral lower lobe predominant pulmonary opacities, left greater right most consistent with pneumonia or aspiration. Mucus secretion or aspirated content noted in the right lower lobe bronchi. No pleural effusion or pneumothorax. Upper Abdomen: Small hepatic hypodense lesions are not characterized. Musculoskeletal: Osteopenia with degenerative changes of the spine. Lower sternal fixation wire. Mild old appearing T12 compression fracture. No acute osseous pathology. Review of the MIP images confirms the above findings. IMPRESSION: 1. No CT evidence of pulmonary artery embolus. 2. Bilateral lower lobe predominant pulmonary opacities, left greater right most consistent with pneumonia or aspiration. Mucus secretion or aspirated content noted in the right lower lobe bronchi. 3. Aortic Atherosclerosis (ICD10-I70.0). Electronically Signed   By: AAnner CreteM.D.   On: 05/23/2020 20:25   ECHOCARDIOGRAM  COMPLETE  Result Date: 05/25/2020    ECHOCARDIOGRAM REPORT   Patient Name:   Meagan ALDERFERDate of Exam: 05/25/2020 Medical Rec #:  [id number]   Height:       59.0 in Accession #:    [id number]  Weight:       75.0 lb Date of Birth:  405-09-1950   BSA:          1.216 m Patient Age:    89years     BP:           138/54 mmHg Patient Gender: F            HR:           65 bpm. Exam Location:  Inpatient Procedure: 2D Echo, Cardiac Doppler and Color Doppler Indications:    CHF-Acute Diastolic  History:        Patient has no prior history of Echocardiogram examinations.                 Risk Factors:Former Smoker. COVID-19. History of lung                 transplant. ESRD.  Sonographer:    AClayton LefortRDCS (AE) Referring Phys: [id number] 1. Left ventricular ejection fraction, by estimation, is 60 to 65%. The left ventricle has normal function. The left ventricle has no regional wall motion abnormalities. Left ventricular diastolic parameters are indeterminate.  2. Right ventricular systolic function is normal. The right ventricular size is normal. There is normal pulmonary artery systolic pressure.  3. The mitral valve is degenerative. Mild mitral valve regurgitation. No evidence of mitral stenosis. Moderate mitral annular calcification.  4. The aortic valve  is tricuspid. Aortic valve regurgitation is trivial. Mild to moderate aortic valve sclerosis/calcification is present, without any evidence of aortic stenosis.  5. The inferior vena cava is normal in size with greater than 50% respiratory variability, suggesting right atrial pressure of 3 mmHg. FINDINGS  Left Ventricle: Left ventricular ejection fraction, by estimation, is 60 to 65%. The left ventricle has normal function. The left ventricle has no regional wall motion abnormalities. The left ventricular internal cavity size was normal in size. There is  no left ventricular hypertrophy. Left ventricular diastolic parameters are indeterminate.  Right Ventricle: The right ventricular size is normal. No increase in right ventricular wall thickness. Right ventricular systolic function is normal. There is normal pulmonary artery systolic pressure. The tricuspid regurgitant velocity is 2.25 m/s, and  with an assumed right atrial pressure of 3 mmHg, the estimated right ventricular systolic pressure is 123XX123 mmHg. Left Atrium: Left atrial size was normal in size. Right Atrium: Right atrial size was normal in size. Pericardium: There is no evidence of pericardial effusion. Mitral Valve: The mitral valve is degenerative in appearance. There is severe thickening of the mitral valve leaflet(s). There is severe calcification of the mitral valve leaflet(s). Moderate mitral annular calcification. Mild mitral valve regurgitation.  No evidence of mitral valve stenosis. Tricuspid Valve: The tricuspid valve is normal in structure. Tricuspid valve regurgitation is mild . No evidence of tricuspid stenosis. Aortic Valve: The aortic valve is tricuspid. Aortic valve regurgitation is trivial. Aortic regurgitation PHT measures 879 msec. Mild to moderate aortic valve sclerosis/calcification is present, without any evidence of aortic stenosis. Aortic valve mean gradient measures 3.0 mmHg. Aortic valve peak gradient measures 8.0 mmHg. Aortic valve area, by VTI measures 1.61 cm. Pulmonic Valve: The pulmonic valve was normal in structure. Pulmonic valve regurgitation is not visualized. No evidence of pulmonic stenosis. Aorta: The aortic root is normal in size and structure. Venous: The inferior vena cava is normal in size with greater than 50% respiratory variability, suggesting right atrial pressure of 3 mmHg. IAS/Shunts: The interatrial septum appears to be lipomatous. No atrial level shunt detected by color flow Doppler.  LEFT VENTRICLE PLAX 2D LVIDd:         4.10 cm  Diastology LVIDs:         2.40 cm  LV e' medial:    6.85 cm/s LV PW:         1.50 cm  LV E/e' medial:  25.0 LV IVS:         1.10 cm  LV e' lateral:   7.29 cm/s LVOT diam:     1.70 cm  LV E/e' lateral: 23.5 LV SV:         49 LV SV Index:   40 LVOT Area:     2.27 cm  RIGHT VENTRICLE             IVC RV Basal diam:  2.80 cm     IVC diam: 0.80 cm RV S prime:     11.60 cm/s TAPSE (M-mode): 2.4 cm LEFT ATRIUM           Index       RIGHT ATRIUM           Index LA diam:      3.50 cm 2.88 cm/m  RA Area:     12.70 cm LA Vol (A2C): 52.0 ml 42.78 ml/m RA Volume:   25.70 ml  21.14 ml/m LA Vol (A4C): 49.9 ml 41.05 ml/m  AORTIC VALVE AV Area (  Vmax):    1.61 cm AV Area (Vmean):   1.54 cm AV Area (VTI):     1.61 cm AV Vmax:           141.00 cm/s AV Vmean:          81.000 cm/s AV VTI:            0.304 m AV Peak Grad:      8.0 mmHg AV Mean Grad:      3.0 mmHg LVOT Vmax:         99.80 cm/s LVOT Vmean:        54.800 cm/s LVOT VTI:          0.215 m LVOT/AV VTI ratio: 0.71 AI PHT:            879 msec  AORTA Ao Root diam: 3.10 cm Ao Asc diam:  2.80 cm MITRAL VALVE                TRICUSPID VALVE MV Area (PHT): 3.21 cm     TR Peak grad:   20.2 mmHg MV Decel Time: 236 msec     TR Vmax:        225.00 cm/s MV E velocity: 171.00 cm/s MV A velocity: 56.00 cm/s   SHUNTS MV E/A ratio:  3.05         Systemic VTI:  0.22 m                             Systemic Diam: 1.70 cm Jenkins Rouge MD Electronically signed by Jenkins Rouge MD Signature Date/Time: 05/25/2020/1:24:43 PM    Final    VAS [place] LOWER EXTREMITY VENOUS (DVT)  Result Date: 05/24/2020  Lower Venous DVT Study Indications: D-Dimer. Other Indications: Covid. Risk Factors: Surgery hx Lung transplant. Comparison Study: No previous exams Performing Technologist: Vonzell Schlatter RVT  Examination Guidelines: A complete evaluation includes B-mode imaging, spectral Doppler, color Doppler, and power Doppler as needed of all accessible portions of each vessel. Bilateral testing is considered an integral part of a complete examination. Limited examinations for reoccurring indications may be performed as noted. The  reflux portion of the exam is performed with the patient in reverse Trendelenburg.  +---------+---------------+---------+-----------+----------+--------------+ RIGHT    CompressibilityPhasicitySpontaneityPropertiesThrombus Aging +---------+---------------+---------+-----------+----------+--------------+ CFV      Full           Yes      Yes                                 +---------+---------------+---------+-----------+----------+--------------+ SFJ      Full                                                        +---------+---------------+---------+-----------+----------+--------------+ FV Prox  Full                                                        +---------+---------------+---------+-----------+----------+--------------+ FV Mid   Full                                                        +---------+---------------+---------+-----------+----------+--------------+  FV DistalFull                                                        +---------+---------------+---------+-----------+----------+--------------+ PFV      Full                                                        +---------+---------------+---------+-----------+----------+--------------+ POP      Full           Yes      Yes                                 +---------+---------------+---------+-----------+----------+--------------+ PTV      Full                                                        +---------+---------------+---------+-----------+----------+--------------+ PERO     Full                                                        +---------+---------------+---------+-----------+----------+--------------+   +---------+---------------+---------+-----------+----------+--------------+ LEFT     CompressibilityPhasicitySpontaneityPropertiesThrombus Aging +---------+---------------+---------+-----------+----------+--------------+ CFV      Full           Yes       Yes                                 +---------+---------------+---------+-----------+----------+--------------+ SFJ      Full                                                        +---------+---------------+---------+-----------+----------+--------------+ FV Prox  Full                                                        +---------+---------------+---------+-----------+----------+--------------+ FV Mid   Full                                                        +---------+---------------+---------+-----------+----------+--------------+ FV DistalFull                                                        +---------+---------------+---------+-----------+----------+--------------+  PFV      Full                                                        +---------+---------------+---------+-----------+----------+--------------+ POP      Full           Yes      Yes                                 +---------+---------------+---------+-----------+----------+--------------+ PTV      Full                                                        +---------+---------------+---------+-----------+----------+--------------+ PERO     Full                                                        +---------+---------------+---------+-----------+----------+--------------+  Summary: BILATERAL: - No evidence of deep vein thrombosis seen in the lower extremities, bilaterally. -No evidence of popliteal cyst, bilaterally.   *See table(s) above for measurements and observations. Electronically signed by Deitra Mayo MD on 05/24/2020 at 11:26:15 AM.    Final      TODAY-DAY OF DISCHARGE:  Subjective:   Meagan Dawson today has no headache,no chest abdominal pain,no new weakness tingling or numbness, feels much better wants to go home today.   Objective:   Blood pressure (!) 132/59, pulse 78, temperature 97.8 F (36.6 C), temperature source Oral, resp. rate 20, height  '4\' 11"'$  (1.499 m), weight 34 kg, SpO2 90 %.  Intake/Output Summary (Last 24 hours) at 05/26/2020 1521 Last data filed at 05/26/2020 0400 Gross per 24 hour  Intake 200 ml  Output 0 ml  Net 200 ml   Filed Weights   05/23/20 1309  Weight: 34 kg    Exam: Awake Alert, Oriented *3, No new F.N deficits, Normal affect Pelican.AT,PERRAL Supple Neck,No JVD, No cervical lymphadenopathy appriciated.  Symmetrical Chest wall movement, Good air movement bilaterally, CTAB RRR,No Gallops,Rubs or new Murmurs, No Parasternal Heave +ve B.Sounds, Abd Soft, Non tender, No organomegaly appriciated, No rebound -guarding or rigidity. No Cyanosis, Clubbing or edema, No new Rash or bruise   PERTINENT RADIOLOGIC STUDIES: DG Chest 2 View  Result Date: 05/23/2020 CLINICAL DATA:  Shortness of breath over the past 2 weeks. EXAM: CHEST - 2 VIEW COMPARISON:  01/18/2020 FINDINGS: Stable enlarged cardiac silhouette and postsurgical changes. The lungs are hyperexpanded with mild prominence of the interstitial markings. Significantly improved bilateral airspace opacity. Minimal bilateral pleural thickening or fluid with significant improvement. IMPRESSION: 1. Significantly improved bilateral pneumonia/edema with minimal residual bilateral pleural thickening or fluid. 2. Stable cardiomegaly and changes of COPD. Electronically Signed   By: Claudie Revering M.D.   On: 05/23/2020 13:37   CT Angio Chest PE W and/or Wo Contrast  Result Date: 05/23/2020 CLINICAL DATA:  70 year old female with chest pain and shortness of breath. EXAM: CT ANGIOGRAPHY CHEST WITH CONTRAST TECHNIQUE: Multidetector  CT imaging of the chest was performed using the standard protocol during bolus administration of intravenous contrast. Multiplanar CT image reconstructions and MIPs were obtained to evaluate the vascular anatomy. CONTRAST:  24m OMNIPAQUE IOHEXOL 350 MG/ML SOLN COMPARISON:  Chest CT dated 01/15/2020. FINDINGS: Cardiovascular: There is mild cardiomegaly.  No pericardial effusion. Mild atherosclerotic calcification of the thoracic aorta. No pulmonary artery embolus identified. Mediastinum/Nodes: No definite hilar adenopathy. Multiple surgical clips noted in the mediastinum. The esophagus is grossly unremarkable. There is retained ingested matter versus mucous secretion within the esophagus. No mediastinal fluid collection. Lungs/Pleura: Bilateral lower lobe predominant pulmonary opacities, left greater right most consistent with pneumonia or aspiration. Mucus secretion or aspirated content noted in the right lower lobe bronchi. No pleural effusion or pneumothorax. Upper Abdomen: Small hepatic hypodense lesions are not characterized. Musculoskeletal: Osteopenia with degenerative changes of the spine. Lower sternal fixation wire. Mild old appearing T12 compression fracture. No acute osseous pathology. Review of the MIP images confirms the above findings. IMPRESSION: 1. No CT evidence of pulmonary artery embolus. 2. Bilateral lower lobe predominant pulmonary opacities, left greater right most consistent with pneumonia or aspiration. Mucus secretion or aspirated content noted in the right lower lobe bronchi. 3. Aortic Atherosclerosis (ICD10-I70.0). Electronically Signed   By: AAnner CreteM.D.   On: 05/23/2020 20:25   ECHOCARDIOGRAM COMPLETE  Result Date: 05/25/2020    ECHOCARDIOGRAM REPORT   Patient Name:   DSENIE FAIRALLDate of Exam: 05/25/2020 Medical Rec #:  [id number]   Height:       59.0 in Accession #:    [id number]  Weight:       75.0 lb Date of Birth:  [birth year]   BSA:          1.216 m Patient Age:    89years     BP:           138/54 mmHg Patient Gender: F            HR:           65 bpm. Exam Location:  Inpatient Procedure: 2D Echo, Cardiac Doppler and Color Doppler Indications:    CHF-Acute Diastolic  History:        Patient has no prior history of Echocardiogram examinations.                 Risk Factors:Former Smoker. COVID-19. History of lung                  transplant. ESRD.  Sonographer:    AClayton LefortRDCS (AE) Referring Phys: [id number] 1. Left ventricular ejection fraction, by estimation, is 60 to 65%. The left ventricle has normal function. The left ventricle has no regional wall motion abnormalities. Left ventricular diastolic parameters are indeterminate.  2. Right ventricular systolic function is normal. The right ventricular size is normal. There is normal pulmonary artery systolic pressure.  3. The mitral valve is degenerative. Mild mitral valve regurgitation. No evidence of mitral stenosis. Moderate mitral annular calcification.  4. The aortic valve is tricuspid. Aortic valve regurgitation is trivial. Mild to moderate aortic valve sclerosis/calcification is present, without any evidence of aortic stenosis.  5. The inferior vena cava is normal in size with greater than 50% respiratory variability, suggesting right atrial pressure of 3 mmHg. FINDINGS  Left Ventricle: Left ventricular ejection fraction, by estimation, is 60 to 65%. The left ventricle has normal function. The left ventricle has no regional wall motion abnormalities.  The left ventricular internal cavity size was normal in size. There is  no left ventricular hypertrophy. Left ventricular diastolic parameters are indeterminate. Right Ventricle: The right ventricular size is normal. No increase in right ventricular wall thickness. Right ventricular systolic function is normal. There is normal pulmonary artery systolic pressure. The tricuspid regurgitant velocity is 2.25 m/s, and  with an assumed right atrial pressure of 3 mmHg, the estimated right ventricular systolic pressure is 123XX123 mmHg. Left Atrium: Left atrial size was normal in size. Right Atrium: Right atrial size was normal in size. Pericardium: There is no evidence of pericardial effusion. Mitral Valve: The mitral valve is degenerative in appearance. There is severe thickening of the mitral valve  leaflet(s). There is severe calcification of the mitral valve leaflet(s). Moderate mitral annular calcification. Mild mitral valve regurgitation.  No evidence of mitral valve stenosis. Tricuspid Valve: The tricuspid valve is normal in structure. Tricuspid valve regurgitation is mild . No evidence of tricuspid stenosis. Aortic Valve: The aortic valve is tricuspid. Aortic valve regurgitation is trivial. Aortic regurgitation PHT measures 879 msec. Mild to moderate aortic valve sclerosis/calcification is present, without any evidence of aortic stenosis. Aortic valve mean gradient measures 3.0 mmHg. Aortic valve peak gradient measures 8.0 mmHg. Aortic valve area, by VTI measures 1.61 cm. Pulmonic Valve: The pulmonic valve was normal in structure. Pulmonic valve regurgitation is not visualized. No evidence of pulmonic stenosis. Aorta: The aortic root is normal in size and structure. Venous: The inferior vena cava is normal in size with greater than 50% respiratory variability, suggesting right atrial pressure of 3 mmHg. IAS/Shunts: The interatrial septum appears to be lipomatous. No atrial level shunt detected by color flow Doppler.  LEFT VENTRICLE PLAX 2D LVIDd:         4.10 cm  Diastology LVIDs:         2.40 cm  LV e' medial:    6.85 cm/s LV PW:         1.50 cm  LV E/e' medial:  25.0 LV IVS:        1.10 cm  LV e' lateral:   7.29 cm/s LVOT diam:     1.70 cm  LV E/e' lateral: 23.5 LV SV:         49 LV SV Index:   40 LVOT Area:     2.27 cm  RIGHT VENTRICLE             IVC RV Basal diam:  2.80 cm     IVC diam: 0.80 cm RV S prime:     11.60 cm/s TAPSE (M-mode): 2.4 cm LEFT ATRIUM           Index       RIGHT ATRIUM           Index LA diam:      3.50 cm 2.88 cm/m  RA Area:     12.70 cm LA Vol (A2C): 52.0 ml 42.78 ml/m RA Volume:   25.70 ml  21.14 ml/m LA Vol (A4C): 49.9 ml 41.05 ml/m  AORTIC VALVE AV Area (Vmax):    1.61 cm AV Area (Vmean):   1.54 cm AV Area (VTI):     1.61 cm AV Vmax:           141.00 cm/s AV Vmean:           81.000 cm/s AV VTI:            0.304 m AV Peak Grad:      8.0  mmHg AV Mean Grad:      3.0 mmHg LVOT Vmax:         99.80 cm/s LVOT Vmean:        54.800 cm/s LVOT VTI:          0.215 m LVOT/AV VTI ratio: 0.71 AI PHT:            879 msec  AORTA Ao Root diam: 3.10 cm Ao Asc diam:  2.80 cm MITRAL VALVE                TRICUSPID VALVE MV Area (PHT): 3.21 cm     TR Peak grad:   20.2 mmHg MV Decel Time: 236 msec     TR Vmax:        225.00 cm/s MV E velocity: 171.00 cm/s MV A velocity: 56.00 cm/s   SHUNTS MV E/A ratio:  3.05         Systemic VTI:  0.22 m                             Systemic Diam: 1.70 cm Jenkins Rouge MD Electronically signed by Jenkins Rouge MD Signature Date/Time: 05/25/2020/1:24:43 PM    Final    VAS [place] LOWER EXTREMITY VENOUS (DVT)  Result Date: 05/24/2020  Lower Venous DVT Study Indications: D-Dimer. Other Indications: Covid. Risk Factors: Surgery hx Lung transplant. Comparison Study: No previous exams Performing Technologist: Vonzell Schlatter RVT  Examination Guidelines: A complete evaluation includes B-mode imaging, spectral Doppler, color Doppler, and power Doppler as needed of all accessible portions of each vessel. Bilateral testing is considered an integral part of a complete examination. Limited examinations for reoccurring indications may be performed as noted. The reflux portion of the exam is performed with the patient in reverse Trendelenburg.  +---------+---------------+---------+-----------+----------+--------------+ RIGHT    CompressibilityPhasicitySpontaneityPropertiesThrombus Aging +---------+---------------+---------+-----------+----------+--------------+ CFV      Full           Yes      Yes                                 +---------+---------------+---------+-----------+----------+--------------+ SFJ      Full                                                        +---------+---------------+---------+-----------+----------+--------------+ FV Prox  Full                                                         +---------+---------------+---------+-----------+----------+--------------+ FV Mid   Full                                                        +---------+---------------+---------+-----------+----------+--------------+ FV DistalFull                                                        +---------+---------------+---------+-----------+----------+--------------+  PFV      Full                                                        +---------+---------------+---------+-----------+----------+--------------+ POP      Full           Yes      Yes                                 +---------+---------------+---------+-----------+----------+--------------+ PTV      Full                                                        +---------+---------------+---------+-----------+----------+--------------+ PERO     Full                                                        +---------+---------------+---------+-----------+----------+--------------+   +---------+---------------+---------+-----------+----------+--------------+ LEFT     CompressibilityPhasicitySpontaneityPropertiesThrombus Aging +---------+---------------+---------+-----------+----------+--------------+ CFV      Full           Yes      Yes                                 +---------+---------------+---------+-----------+----------+--------------+ SFJ      Full                                                        +---------+---------------+---------+-----------+----------+--------------+ FV Prox  Full                                                        +---------+---------------+---------+-----------+----------+--------------+ FV Mid   Full                                                        +---------+---------------+---------+-----------+----------+--------------+ FV DistalFull                                                         +---------+---------------+---------+-----------+----------+--------------+ PFV      Full                                                        +---------+---------------+---------+-----------+----------+--------------+   POP      Full           Yes      Yes                                 +---------+---------------+---------+-----------+----------+--------------+ PTV      Full                                                        +---------+---------------+---------+-----------+----------+--------------+ PERO     Full                                                        +---------+---------------+---------+-----------+----------+--------------+  Summary: BILATERAL: - No evidence of deep vein thrombosis seen in the lower extremities, bilaterally. -No evidence of popliteal cyst, bilaterally.   *See table(s) above for measurements and observations. Electronically signed by Deitra Mayo MD on 05/24/2020 at 11:26:15 AM.    Final      PERTINENT LAB RESULTS: CBC: Recent Labs    05/25/20 0112 05/26/20 0049  WBC 3.4* 6.2  HGB 8.6* 9.0*  HCT 27.5* 28.6*  PLT 256 266   CMET CMP     Component Value Date/Time   NA 135 05/26/2020 0049   K 3.4 (L) 05/26/2020 0049   CL 98 05/26/2020 0049   CO2 22 05/26/2020 0049   GLUCOSE 148 (H) 05/26/2020 0049   BUN 24 (H) 05/26/2020 0049   CREATININE 3.73 (H) 05/26/2020 0049   CALCIUM 7.4 (L) 05/26/2020 0049   PROT 4.7 (L) 05/26/2020 0049   ALBUMIN 2.4 (L) 05/26/2020 0049   AST 20 05/26/2020 0049   ALT 7 05/26/2020 0049   ALKPHOS 51 05/26/2020 0049   BILITOT 0.5 05/26/2020 0049   GFRNONAA 12 (L) 05/26/2020 0049   GFRAA 31 (L) 06/15/2016 1419    GFR Estimated Creatinine Clearance: 7.4 mL/min (A) (by C-G formula based on SCr of 3.73 mg/dL (H)). No results for input(s): LIPASE, AMYLASE in the last 72 hours. No results for input(s): CKTOTAL, CKMB, CKMBINDEX, TROPONINI in the last 72 hours. Invalid input(s):  POCBNP Recent Labs    05/25/20 0112 05/26/20 0049  DDIMER 0.87* 1.01*   Recent Labs    05/24/20 0609  HGBA1C 6.7*   Recent Labs    05/23/20 1930  TRIG 422*   No results for input(s): TSH, T4TOTAL, T3FREE, THYROIDAB in the last 72 hours.  Invalid input(s): FREET3 Recent Labs    05/23/20 1701  FERRITIN 2,012*   Coags: No results for input(s): INR in the last 72 hours.  Invalid input(s): PT Microbiology: Recent Results (from the past 240 hour(s))  SARS Coronavirus 2 by RT PCR (hospital order, performed in [facility] hospital lab) Nasopharyngeal Nasopharyngeal Swab     Status: Abnormal   Collection Time: 05/23/20  6:38 PM   Specimen: Nasopharyngeal Swab  Result Value Ref Range Status   SARS Coronavirus 2 POSITIVE (A) NEGATIVE Final    Comment: RESULT CALLED TO, READ BACK BY AND VERIFIED WITH: Trudi Ida RN 2007 05/23/20 A BROWNING (NOTE) SARS-CoV-2 target nucleic acids are DETECTED  SARS-CoV-2 RNA is  generally detectable in upper respiratory specimens  during the acute phase of infection.  Positive results are indicative  of the presence of the identified virus, but do not rule out bacterial infection or co-infection with other pathogens not detected by the test.  Clinical correlation with patient history and  other diagnostic information is necessary to determine patient infection status.  The expected result is negative.  Fact Sheet for Patients:   StrictlyIdeas.no   Fact Sheet for Healthcare Providers:   BankingDealers.co.za    This test is not yet approved or cleared by the [place] FDA and  has been authorized for detection and/or diagnosis of SARS-CoV-2 by FDA under an Emergency Use Authorization (EUA).  This EUA will remain in effect (meaning this t est can be used) for the duration of  the COVID-19 declaration under Section 564(b)(1) of the Act, 21 U.S.C. section 360-bbb-3(b)(1), unless the authorization  is terminated or revoked sooner.  Performed at [facility], [place] [address] [place], [place] [address]   Respiratory (~20 pathogens) panel by PCR     Status: None   Collection Time: 05/23/20  6:38 PM   Specimen: Nasopharyngeal Swab; Respiratory  Result Value Ref Range Status   Adenovirus NOT DETECTED NOT DETECTED Final   Coronavirus 229E NOT DETECTED NOT DETECTED Final    Comment: (NOTE) The Coronavirus on the Respiratory Panel, DOES NOT test for the novel  Coronavirus (2019 nCoV)    Coronavirus HKU1 NOT DETECTED NOT DETECTED Final   Coronavirus NL63 NOT DETECTED NOT DETECTED Final   Coronavirus OC43 NOT DETECTED NOT DETECTED Final   Metapneumovirus NOT DETECTED NOT DETECTED Final   Rhinovirus / Enterovirus NOT DETECTED NOT DETECTED Final   Influenza A NOT DETECTED NOT DETECTED Final   Influenza B NOT DETECTED NOT DETECTED Final   Parainfluenza Virus 1 NOT DETECTED NOT DETECTED Final   Parainfluenza Virus 2 NOT DETECTED NOT DETECTED Final   Parainfluenza Virus 3 NOT DETECTED NOT DETECTED Final   Parainfluenza Virus 4 NOT DETECTED NOT DETECTED Final   Respiratory Syncytial Virus NOT DETECTED NOT DETECTED Final   Bordetella pertussis NOT DETECTED NOT DETECTED Final   Bordetella Parapertussis NOT DETECTED NOT DETECTED Final   Chlamydophila pneumoniae NOT DETECTED NOT DETECTED Final   Mycoplasma pneumoniae NOT DETECTED NOT DETECTED Final    Comment: Performed at [facility], [place], [place], [place] [address]  Blood Culture (routine x 2)     Status: None (Preliminary result)   Collection Time: 05/23/20  7:30 PM   Specimen: BLOOD  Result Value Ref Range Status   Specimen Description BLOOD RUE  Final   Special Requests   Final    BOTTLES DRAWN AEROBIC AND ANAEROBIC Blood Culture adequate volume   Culture   Final    NO GROWTH 3 DAYS Performed at [facility], [place] [address], [place], [place] [address]    Report Status PENDING  Incomplete  Blood  Culture (routine x 2)     Status: None (Preliminary result)   Collection Time: 05/23/20  7:45 PM   Specimen: BLOOD  Result Value Ref Range Status   Specimen Description BLOOD SITE NOT SPECIFIED  Final   Special Requests   Final    BOTTLES DRAWN AEROBIC AND ANAEROBIC Blood Culture adequate volume   Culture   Final    NO GROWTH 3 DAYS Performed at [facility], [address], [place], [place] [address]    Report Status  PENDING  Incomplete  MRSA PCR Screening     Status: None   Collection Time: 05/23/20  8:57 PM   Specimen: Nasal Mucosa; Nasopharyngeal  Result Value Ref Range Status   MRSA by PCR NEGATIVE NEGATIVE Final    Comment:        The GeneXpert MRSA Assay (FDA approved for NASAL specimens only), is one component of a comprehensive MRSA colonization surveillance program. It is not intended to diagnose MRSA infection nor to guide or monitor treatment for MRSA infections. Performed at [facility], [place] [address], [place], [place] [address]     FURTHER DISCHARGE INSTRUCTIONS:  Get Medicines reviewed and adjusted: Please take all your medications with you for your next visit with your Primary MD  Laboratory/radiological data: Please request your Primary MD to go over all hospital tests and procedure/radiological results at the follow up, please ask your Primary MD to get all Hospital records sent to his/her office.  In some cases, they will be blood work, cultures and biopsy results pending at the time of your discharge. Please request that your primary care M.D. goes through all the records of your hospital data and follows up on these results.  Also Note the following: If you experience worsening of your admission symptoms, develop shortness of breath, life threatening emergency, suicidal or homicidal thoughts you must seek medical attention immediately by calling 911 or calling your MD immediately  if symptoms less severe.  You must read complete  instructions/literature along with all the possible adverse reactions/side effects for all the Medicines you take and that have been prescribed to you. Take any new Medicines after you have completely understood and accpet all the possible adverse reactions/side effects.   Do not drive when taking Pain medications or sleeping medications (Benzodaizepines)  Do not take more than prescribed Pain, Sleep and Anxiety Medications. It is not advisable to combine anxiety,sleep and pain medications without talking with your primary care practitioner  Special Instructions: If you have smoked or chewed Tobacco  in the last 2 yrs please stop smoking, stop any regular Alcohol  and or any Recreational drug use.  Wear Seat belts while driving.  Please note: You were cared for by a hospitalist during your hospital stay. Once you are discharged, your primary care physician will handle any further medical issues. Please note that NO REFILLS for any discharge medications will be authorized once you are discharged, as it is imperative that you return to your primary care physician (or establish a relationship with a primary care physician if you do not have one) for your post hospital discharge needs so that they can reassess your need for medications and monitor your lab values.  Total Time spent coordinating discharge including counseling, education and face to face time equals 35 minutes.  Signed: Oren Binet 05/26/2020 3:21 PM

## 2020-05-27 DIAGNOSIS — Z992 Dependence on renal dialysis: Secondary | ICD-10-CM | POA: Diagnosis not present

## 2020-05-27 DIAGNOSIS — E785 Hyperlipidemia, unspecified: Secondary | ICD-10-CM | POA: Diagnosis not present

## 2020-05-27 DIAGNOSIS — I120 Hypertensive chronic kidney disease with stage 5 chronic kidney disease or end stage renal disease: Secondary | ICD-10-CM | POA: Diagnosis not present

## 2020-05-27 DIAGNOSIS — Z792 Long term (current) use of antibiotics: Secondary | ICD-10-CM | POA: Diagnosis not present

## 2020-05-27 DIAGNOSIS — Z7952 Long term (current) use of systemic steroids: Secondary | ICD-10-CM | POA: Diagnosis not present

## 2020-05-27 DIAGNOSIS — Z87891 Personal history of nicotine dependence: Secondary | ICD-10-CM | POA: Diagnosis not present

## 2020-05-27 DIAGNOSIS — Z8709 Personal history of other diseases of the respiratory system: Secondary | ICD-10-CM | POA: Diagnosis not present

## 2020-05-27 DIAGNOSIS — Z9181 History of falling: Secondary | ICD-10-CM | POA: Diagnosis not present

## 2020-05-27 DIAGNOSIS — D631 Anemia in chronic kidney disease: Secondary | ICD-10-CM | POA: Diagnosis not present

## 2020-05-27 DIAGNOSIS — I10 Essential (primary) hypertension: Secondary | ICD-10-CM | POA: Diagnosis not present

## 2020-05-27 DIAGNOSIS — D509 Iron deficiency anemia, unspecified: Secondary | ICD-10-CM | POA: Diagnosis not present

## 2020-05-27 DIAGNOSIS — N186 End stage renal disease: Secondary | ICD-10-CM | POA: Diagnosis not present

## 2020-05-27 DIAGNOSIS — E1122 Type 2 diabetes mellitus with diabetic chronic kidney disease: Secondary | ICD-10-CM | POA: Diagnosis not present

## 2020-05-27 DIAGNOSIS — Z7982 Long term (current) use of aspirin: Secondary | ICD-10-CM | POA: Diagnosis not present

## 2020-05-27 DIAGNOSIS — U071 COVID-19: Secondary | ICD-10-CM | POA: Diagnosis not present

## 2020-05-27 DIAGNOSIS — Z942 Lung transplant status: Secondary | ICD-10-CM | POA: Diagnosis not present

## 2020-05-27 DIAGNOSIS — N2581 Secondary hyperparathyroidism of renal origin: Secondary | ICD-10-CM | POA: Diagnosis not present

## 2020-05-27 DIAGNOSIS — J9601 Acute respiratory failure with hypoxia: Secondary | ICD-10-CM | POA: Diagnosis not present

## 2020-05-27 DIAGNOSIS — J1282 Pneumonia due to coronavirus disease 2019: Secondary | ICD-10-CM | POA: Diagnosis not present

## 2020-05-27 DIAGNOSIS — E876 Hypokalemia: Secondary | ICD-10-CM | POA: Diagnosis not present

## 2020-05-27 DIAGNOSIS — Z79891 Long term (current) use of opiate analgesic: Secondary | ICD-10-CM | POA: Diagnosis not present

## 2020-05-27 NOTE — Progress Notes (Signed)
Hypoglycemic Event  CBG: 52  Treatment: 8oz orange juice  Symptoms: none  Follow-up CBG: Time:0811 CBG Result:122  Possible Reasons for Event:   Comments/MD notified:aware, pt asymptomatic    Meagan Dawson

## 2020-05-28 LAB — CULTURE, BLOOD (ROUTINE X 2)
Culture: NO GROWTH
Culture: NO GROWTH
Special Requests: ADEQUATE
Special Requests: ADEQUATE

## 2020-05-30 DIAGNOSIS — I10 Essential (primary) hypertension: Secondary | ICD-10-CM | POA: Diagnosis not present

## 2020-05-30 DIAGNOSIS — Z992 Dependence on renal dialysis: Secondary | ICD-10-CM | POA: Diagnosis not present

## 2020-05-30 DIAGNOSIS — N186 End stage renal disease: Secondary | ICD-10-CM | POA: Diagnosis not present

## 2020-05-30 DIAGNOSIS — D509 Iron deficiency anemia, unspecified: Secondary | ICD-10-CM | POA: Diagnosis not present

## 2020-05-30 DIAGNOSIS — N2581 Secondary hyperparathyroidism of renal origin: Secondary | ICD-10-CM | POA: Diagnosis not present

## 2020-05-30 DIAGNOSIS — D631 Anemia in chronic kidney disease: Secondary | ICD-10-CM | POA: Diagnosis not present

## 2020-05-31 DIAGNOSIS — Z8709 Personal history of other diseases of the respiratory system: Secondary | ICD-10-CM | POA: Diagnosis not present

## 2020-05-31 DIAGNOSIS — E876 Hypokalemia: Secondary | ICD-10-CM | POA: Diagnosis not present

## 2020-05-31 DIAGNOSIS — Z79891 Long term (current) use of opiate analgesic: Secondary | ICD-10-CM | POA: Diagnosis not present

## 2020-05-31 DIAGNOSIS — D631 Anemia in chronic kidney disease: Secondary | ICD-10-CM | POA: Diagnosis not present

## 2020-05-31 DIAGNOSIS — Z7952 Long term (current) use of systemic steroids: Secondary | ICD-10-CM | POA: Diagnosis not present

## 2020-05-31 DIAGNOSIS — U071 COVID-19: Secondary | ICD-10-CM | POA: Diagnosis not present

## 2020-05-31 DIAGNOSIS — Z87891 Personal history of nicotine dependence: Secondary | ICD-10-CM | POA: Diagnosis not present

## 2020-05-31 DIAGNOSIS — Z792 Long term (current) use of antibiotics: Secondary | ICD-10-CM | POA: Diagnosis not present

## 2020-05-31 DIAGNOSIS — Z992 Dependence on renal dialysis: Secondary | ICD-10-CM | POA: Diagnosis not present

## 2020-05-31 DIAGNOSIS — J1282 Pneumonia due to coronavirus disease 2019: Secondary | ICD-10-CM | POA: Diagnosis not present

## 2020-05-31 DIAGNOSIS — Z9181 History of falling: Secondary | ICD-10-CM | POA: Diagnosis not present

## 2020-05-31 DIAGNOSIS — J9601 Acute respiratory failure with hypoxia: Secondary | ICD-10-CM | POA: Diagnosis not present

## 2020-05-31 DIAGNOSIS — E785 Hyperlipidemia, unspecified: Secondary | ICD-10-CM | POA: Diagnosis not present

## 2020-05-31 DIAGNOSIS — N186 End stage renal disease: Secondary | ICD-10-CM | POA: Diagnosis not present

## 2020-05-31 DIAGNOSIS — Z942 Lung transplant status: Secondary | ICD-10-CM | POA: Diagnosis not present

## 2020-05-31 DIAGNOSIS — E1122 Type 2 diabetes mellitus with diabetic chronic kidney disease: Secondary | ICD-10-CM | POA: Diagnosis not present

## 2020-05-31 DIAGNOSIS — I120 Hypertensive chronic kidney disease with stage 5 chronic kidney disease or end stage renal disease: Secondary | ICD-10-CM | POA: Diagnosis not present

## 2020-05-31 DIAGNOSIS — Z7982 Long term (current) use of aspirin: Secondary | ICD-10-CM | POA: Diagnosis not present

## 2020-06-01 DIAGNOSIS — E119 Type 2 diabetes mellitus without complications: Secondary | ICD-10-CM | POA: Diagnosis not present

## 2020-06-01 DIAGNOSIS — E8779 Other fluid overload: Secondary | ICD-10-CM | POA: Diagnosis not present

## 2020-06-01 DIAGNOSIS — N2581 Secondary hyperparathyroidism of renal origin: Secondary | ICD-10-CM | POA: Diagnosis not present

## 2020-06-01 DIAGNOSIS — D631 Anemia in chronic kidney disease: Secondary | ICD-10-CM | POA: Diagnosis not present

## 2020-06-01 DIAGNOSIS — N186 End stage renal disease: Secondary | ICD-10-CM | POA: Diagnosis not present

## 2020-06-02 DIAGNOSIS — N186 End stage renal disease: Secondary | ICD-10-CM | POA: Diagnosis not present

## 2020-06-02 DIAGNOSIS — I517 Cardiomegaly: Secondary | ICD-10-CM | POA: Diagnosis not present

## 2020-06-02 DIAGNOSIS — E785 Hyperlipidemia, unspecified: Secondary | ICD-10-CM | POA: Diagnosis not present

## 2020-06-02 DIAGNOSIS — J90 Pleural effusion, not elsewhere classified: Secondary | ICD-10-CM | POA: Diagnosis not present

## 2020-06-02 DIAGNOSIS — Z992 Dependence on renal dialysis: Secondary | ICD-10-CM | POA: Diagnosis not present

## 2020-06-02 DIAGNOSIS — D649 Anemia, unspecified: Secondary | ICD-10-CM | POA: Diagnosis not present

## 2020-06-02 DIAGNOSIS — Z79899 Other long term (current) drug therapy: Secondary | ICD-10-CM | POA: Diagnosis not present

## 2020-06-02 DIAGNOSIS — R942 Abnormal results of pulmonary function studies: Secondary | ICD-10-CM | POA: Diagnosis not present

## 2020-06-02 DIAGNOSIS — Z681 Body mass index (BMI) 19 or less, adult: Secondary | ICD-10-CM | POA: Diagnosis not present

## 2020-06-02 DIAGNOSIS — T86818 Other complications of lung transplant: Secondary | ICD-10-CM | POA: Diagnosis not present

## 2020-06-02 DIAGNOSIS — I120 Hypertensive chronic kidney disease with stage 5 chronic kidney disease or end stage renal disease: Secondary | ICD-10-CM | POA: Diagnosis not present

## 2020-06-02 DIAGNOSIS — I10 Essential (primary) hypertension: Secondary | ICD-10-CM | POA: Diagnosis not present

## 2020-06-02 DIAGNOSIS — R058 Other specified cough: Secondary | ICD-10-CM | POA: Diagnosis not present

## 2020-06-02 DIAGNOSIS — Z942 Lung transplant status: Secondary | ICD-10-CM | POA: Diagnosis not present

## 2020-06-02 DIAGNOSIS — E46 Unspecified protein-calorie malnutrition: Secondary | ICD-10-CM | POA: Diagnosis not present

## 2020-06-02 DIAGNOSIS — D631 Anemia in chronic kidney disease: Secondary | ICD-10-CM | POA: Diagnosis not present

## 2020-06-02 DIAGNOSIS — Z4824 Encounter for aftercare following lung transplant: Secondary | ICD-10-CM | POA: Diagnosis not present

## 2020-06-02 DIAGNOSIS — Z8616 Personal history of COVID-19: Secondary | ICD-10-CM | POA: Diagnosis not present

## 2020-06-02 DIAGNOSIS — Z9049 Acquired absence of other specified parts of digestive tract: Secondary | ICD-10-CM | POA: Diagnosis not present

## 2020-06-02 DIAGNOSIS — Z7952 Long term (current) use of systemic steroids: Secondary | ICD-10-CM | POA: Diagnosis not present

## 2020-06-02 DIAGNOSIS — D849 Immunodeficiency, unspecified: Secondary | ICD-10-CM | POA: Diagnosis not present

## 2020-06-02 DIAGNOSIS — D84821 Immunodeficiency due to drugs: Secondary | ICD-10-CM | POA: Diagnosis not present

## 2020-06-02 DIAGNOSIS — Z5181 Encounter for therapeutic drug level monitoring: Secondary | ICD-10-CM | POA: Diagnosis not present

## 2020-06-02 DIAGNOSIS — J449 Chronic obstructive pulmonary disease, unspecified: Secondary | ICD-10-CM | POA: Diagnosis not present

## 2020-06-03 DIAGNOSIS — Z792 Long term (current) use of antibiotics: Secondary | ICD-10-CM | POA: Diagnosis not present

## 2020-06-03 DIAGNOSIS — Z7982 Long term (current) use of aspirin: Secondary | ICD-10-CM | POA: Diagnosis not present

## 2020-06-03 DIAGNOSIS — E8779 Other fluid overload: Secondary | ICD-10-CM | POA: Diagnosis not present

## 2020-06-03 DIAGNOSIS — Z7952 Long term (current) use of systemic steroids: Secondary | ICD-10-CM | POA: Diagnosis not present

## 2020-06-03 DIAGNOSIS — Z9181 History of falling: Secondary | ICD-10-CM | POA: Diagnosis not present

## 2020-06-03 DIAGNOSIS — I120 Hypertensive chronic kidney disease with stage 5 chronic kidney disease or end stage renal disease: Secondary | ICD-10-CM | POA: Diagnosis not present

## 2020-06-03 DIAGNOSIS — N2581 Secondary hyperparathyroidism of renal origin: Secondary | ICD-10-CM | POA: Diagnosis not present

## 2020-06-03 DIAGNOSIS — Z8709 Personal history of other diseases of the respiratory system: Secondary | ICD-10-CM | POA: Diagnosis not present

## 2020-06-03 DIAGNOSIS — J9601 Acute respiratory failure with hypoxia: Secondary | ICD-10-CM | POA: Diagnosis not present

## 2020-06-03 DIAGNOSIS — N186 End stage renal disease: Secondary | ICD-10-CM | POA: Diagnosis not present

## 2020-06-03 DIAGNOSIS — Z87891 Personal history of nicotine dependence: Secondary | ICD-10-CM | POA: Diagnosis not present

## 2020-06-03 DIAGNOSIS — U071 COVID-19: Secondary | ICD-10-CM | POA: Diagnosis not present

## 2020-06-03 DIAGNOSIS — D631 Anemia in chronic kidney disease: Secondary | ICD-10-CM | POA: Diagnosis not present

## 2020-06-03 DIAGNOSIS — J1282 Pneumonia due to coronavirus disease 2019: Secondary | ICD-10-CM | POA: Diagnosis not present

## 2020-06-03 DIAGNOSIS — Z79891 Long term (current) use of opiate analgesic: Secondary | ICD-10-CM | POA: Diagnosis not present

## 2020-06-03 DIAGNOSIS — E785 Hyperlipidemia, unspecified: Secondary | ICD-10-CM | POA: Diagnosis not present

## 2020-06-03 DIAGNOSIS — Z942 Lung transplant status: Secondary | ICD-10-CM | POA: Diagnosis not present

## 2020-06-03 DIAGNOSIS — E1122 Type 2 diabetes mellitus with diabetic chronic kidney disease: Secondary | ICD-10-CM | POA: Diagnosis not present

## 2020-06-03 DIAGNOSIS — E876 Hypokalemia: Secondary | ICD-10-CM | POA: Diagnosis not present

## 2020-06-03 DIAGNOSIS — Z992 Dependence on renal dialysis: Secondary | ICD-10-CM | POA: Diagnosis not present

## 2020-06-04 DIAGNOSIS — D631 Anemia in chronic kidney disease: Secondary | ICD-10-CM | POA: Diagnosis not present

## 2020-06-04 DIAGNOSIS — N2581 Secondary hyperparathyroidism of renal origin: Secondary | ICD-10-CM | POA: Diagnosis not present

## 2020-06-04 DIAGNOSIS — N186 End stage renal disease: Secondary | ICD-10-CM | POA: Diagnosis not present

## 2020-06-04 DIAGNOSIS — E8779 Other fluid overload: Secondary | ICD-10-CM | POA: Diagnosis not present

## 2020-06-06 DIAGNOSIS — R627 Adult failure to thrive: Secondary | ICD-10-CM | POA: Diagnosis not present

## 2020-06-06 DIAGNOSIS — J90 Pleural effusion, not elsewhere classified: Secondary | ICD-10-CM | POA: Diagnosis not present

## 2020-06-06 DIAGNOSIS — K3184 Gastroparesis: Secondary | ICD-10-CM | POA: Diagnosis not present

## 2020-06-06 DIAGNOSIS — I469 Cardiac arrest, cause unspecified: Secondary | ICD-10-CM | POA: Diagnosis not present

## 2020-06-06 DIAGNOSIS — I468 Cardiac arrest due to other underlying condition: Secondary | ICD-10-CM | POA: Diagnosis not present

## 2020-06-06 DIAGNOSIS — D849 Immunodeficiency, unspecified: Secondary | ICD-10-CM | POA: Diagnosis not present

## 2020-06-06 DIAGNOSIS — T86812 Lung transplant infection: Secondary | ICD-10-CM | POA: Diagnosis not present

## 2020-06-06 DIAGNOSIS — N186 End stage renal disease: Secondary | ICD-10-CM | POA: Diagnosis not present

## 2020-06-06 DIAGNOSIS — Z992 Dependence on renal dialysis: Secondary | ICD-10-CM | POA: Diagnosis not present

## 2020-06-06 DIAGNOSIS — Z942 Lung transplant status: Secondary | ICD-10-CM | POA: Diagnosis not present

## 2020-06-06 DIAGNOSIS — E785 Hyperlipidemia, unspecified: Secondary | ICD-10-CM | POA: Diagnosis not present

## 2020-06-06 DIAGNOSIS — R609 Edema, unspecified: Secondary | ICD-10-CM | POA: Diagnosis not present

## 2020-06-06 DIAGNOSIS — J989 Respiratory disorder, unspecified: Secondary | ICD-10-CM | POA: Diagnosis not present

## 2020-06-06 DIAGNOSIS — E43 Unspecified severe protein-calorie malnutrition: Secondary | ICD-10-CM | POA: Diagnosis not present

## 2020-06-06 DIAGNOSIS — D631 Anemia in chronic kidney disease: Secondary | ICD-10-CM | POA: Diagnosis not present

## 2020-06-06 DIAGNOSIS — Z7982 Long term (current) use of aspirin: Secondary | ICD-10-CM | POA: Diagnosis not present

## 2020-06-06 DIAGNOSIS — J9811 Atelectasis: Secondary | ICD-10-CM | POA: Diagnosis not present

## 2020-06-06 DIAGNOSIS — Z9911 Dependence on respirator [ventilator] status: Secondary | ICD-10-CM | POA: Diagnosis not present

## 2020-06-06 DIAGNOSIS — E0922 Drug or chemical induced diabetes mellitus with diabetic chronic kidney disease: Secondary | ICD-10-CM | POA: Diagnosis not present

## 2020-06-06 DIAGNOSIS — K219 Gastro-esophageal reflux disease without esophagitis: Secondary | ICD-10-CM | POA: Diagnosis not present

## 2020-06-06 DIAGNOSIS — I120 Hypertensive chronic kidney disease with stage 5 chronic kidney disease or end stage renal disease: Secondary | ICD-10-CM | POA: Diagnosis not present

## 2020-06-06 DIAGNOSIS — Z79899 Other long term (current) drug therapy: Secondary | ICD-10-CM | POA: Diagnosis not present

## 2020-06-06 DIAGNOSIS — J9601 Acute respiratory failure with hypoxia: Secondary | ICD-10-CM | POA: Diagnosis not present

## 2020-06-06 DIAGNOSIS — T86811 Lung transplant failure: Secondary | ICD-10-CM | POA: Diagnosis not present

## 2020-06-06 DIAGNOSIS — Z8616 Personal history of COVID-19: Secondary | ICD-10-CM | POA: Diagnosis not present

## 2020-06-06 DIAGNOSIS — Z87891 Personal history of nicotine dependence: Secondary | ICD-10-CM | POA: Diagnosis not present

## 2020-06-06 DIAGNOSIS — N250 Renal osteodystrophy: Secondary | ICD-10-CM | POA: Diagnosis not present

## 2020-06-06 DIAGNOSIS — Z880 Allergy status to penicillin: Secondary | ICD-10-CM | POA: Diagnosis not present

## 2020-06-06 DIAGNOSIS — T380X5A Adverse effect of glucocorticoids and synthetic analogues, initial encounter: Secondary | ICD-10-CM | POA: Diagnosis not present

## 2020-06-28 DEATH — deceased

## 2021-11-13 IMAGING — CT CT ANGIO CHEST
2 of 6 series · 19 of 36 positions shown · IV contrast (omnipaque)
Comparison: Chest CT dated 01/15/2020.

CLINICAL DATA: 69-year-old female with chest pain and shortness of
breath.

EXAM:
CT ANGIOGRAPHY CHEST WITH CONTRAST
TECHNIQUE: Multidetector CT imaging of the chest was performed using the
standard protocol during bolus administration of intravenous
contrast. Multiplanar CT image reconstructions and MIPs were
obtained to evaluate the vascular anatomy.
CONTRAST:  53mL OMNIPAQUE IOHEXOL 350 MG/ML SOLN

[Series 7: pe thins · axial · 0.60mm/px · z∈[+1003,+1252]mm · 18 of 396 slices shown]
[im 20/396  lung]
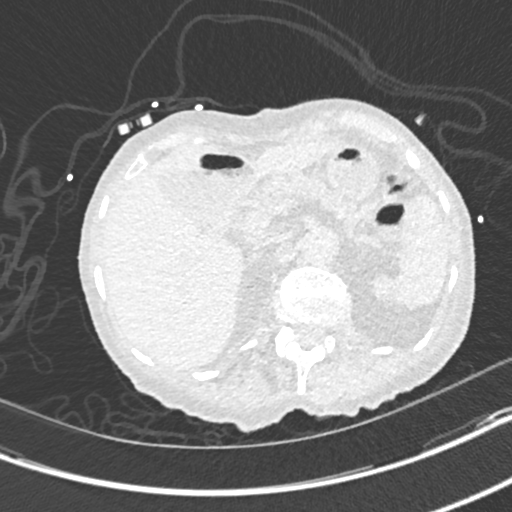
[im 40/396  mediastinal]
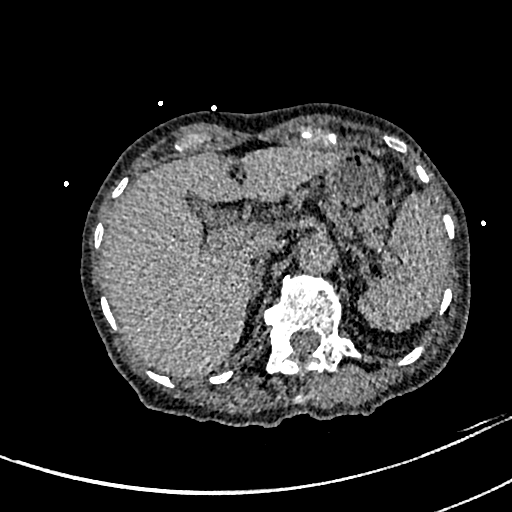
[im 60/396  lung]
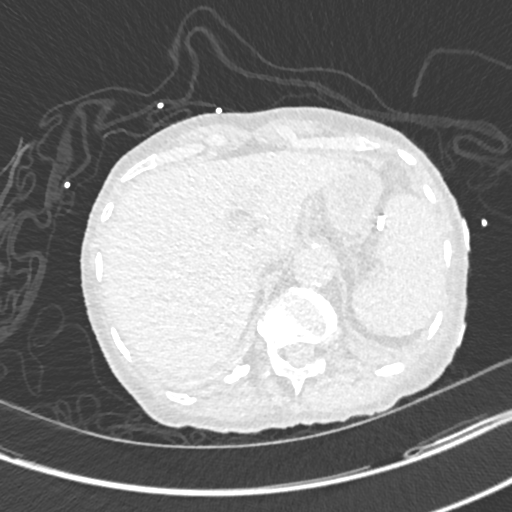
[im 80/396  mediastinal]
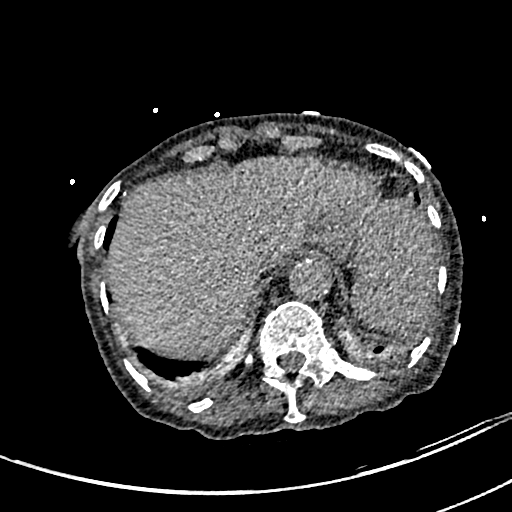
[im 99/396  lung]
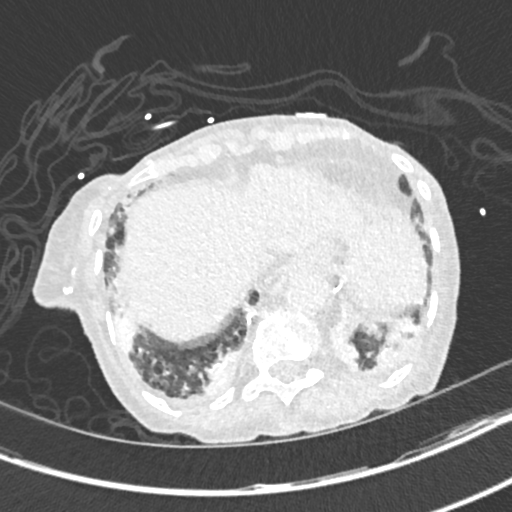
[im 119/396  mediastinal]
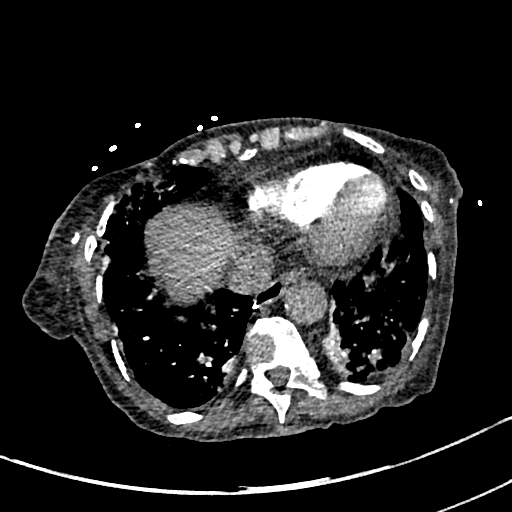
[im 139/396  lung]
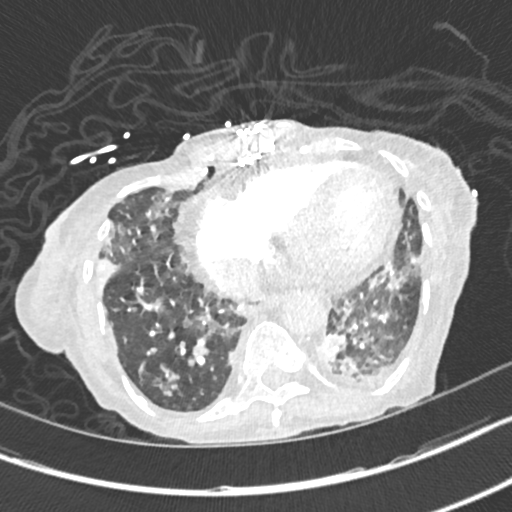
[im 159/396  mediastinal]
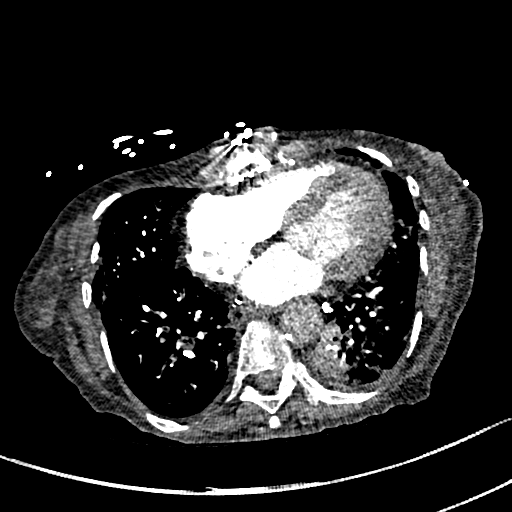
[im 178/396  lung]
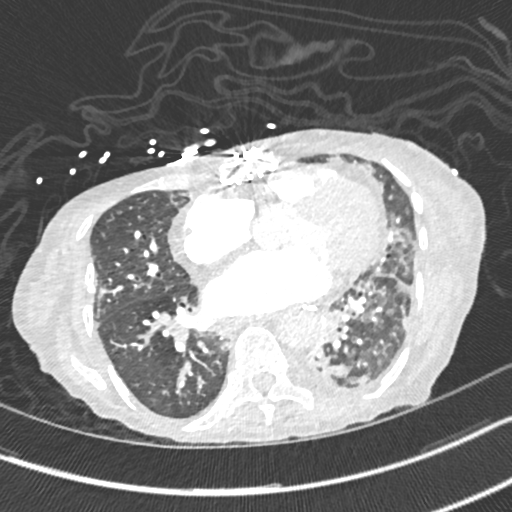
[im 218/396  mediastinal]
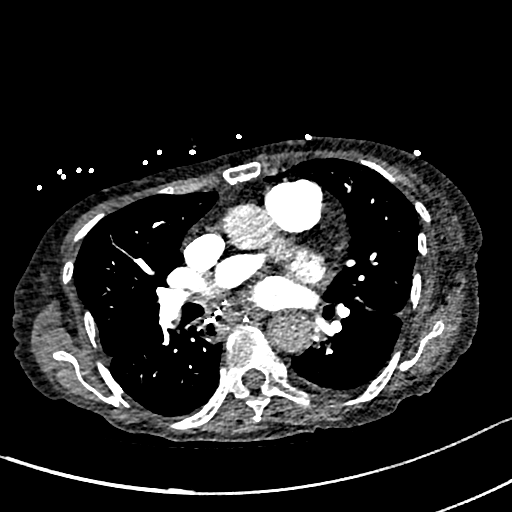
[im 238/396  lung]
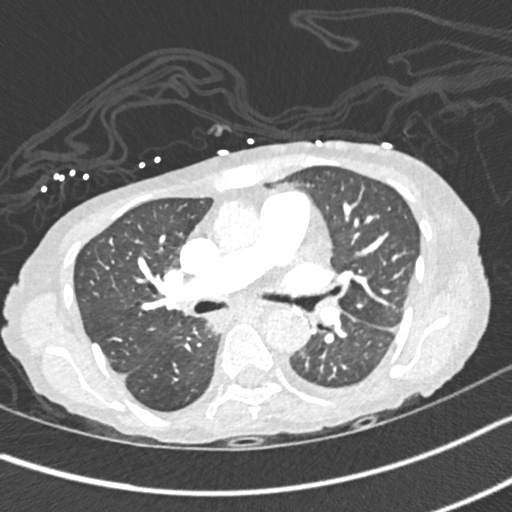
[im 257/396  mediastinal]
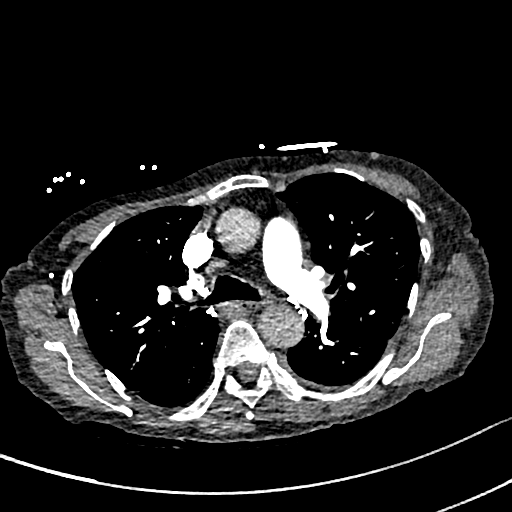
[im 277/396  lung]
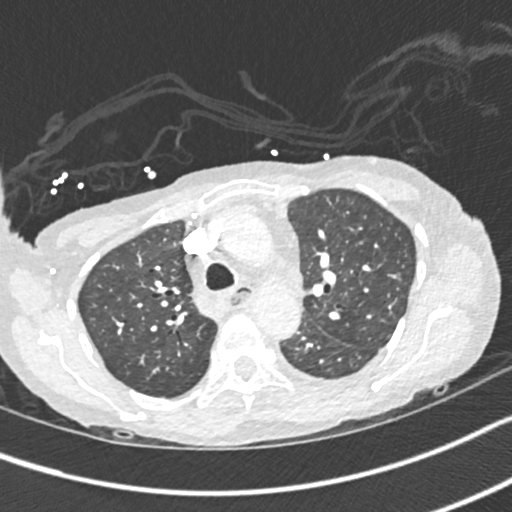
[im 297/396  mediastinal]
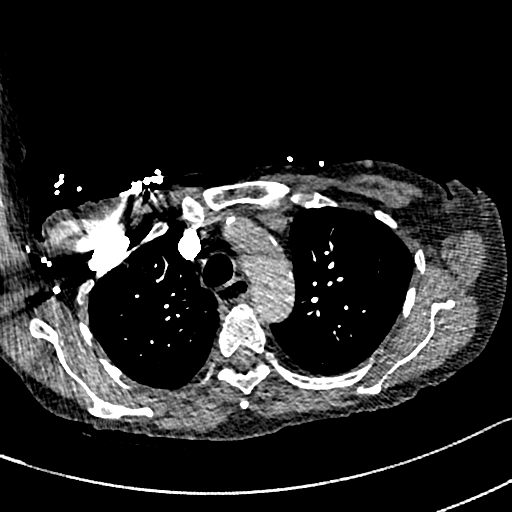
[im 317/396  lung]
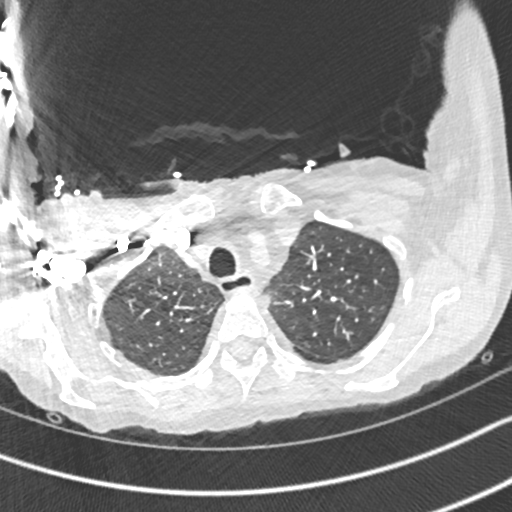
[im 336/396  mediastinal]
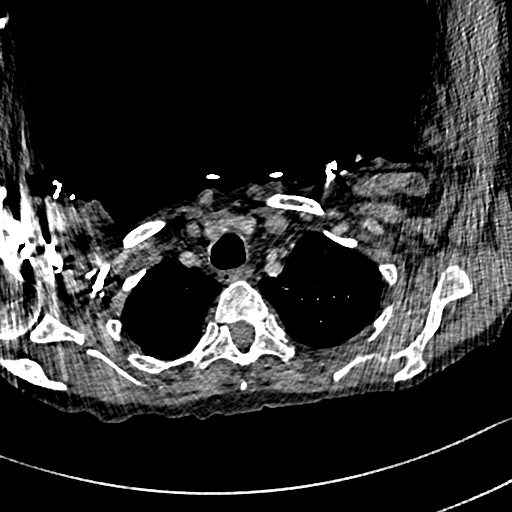
[im 356/396  lung]
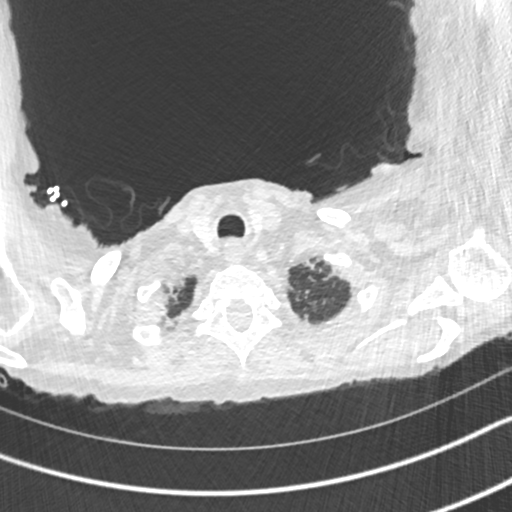
[im 376/396  mediastinal]
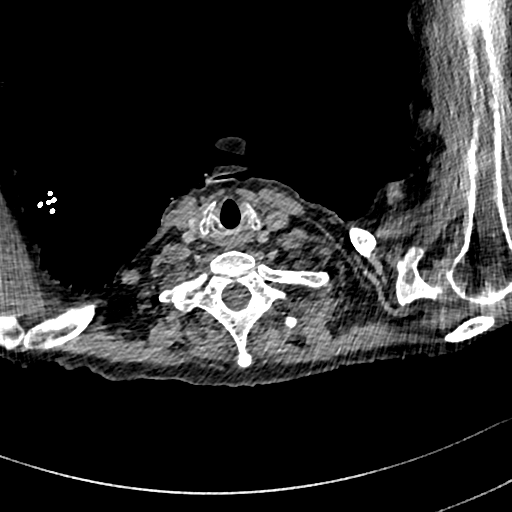

[Series 8: pe 2mm cor · coronal · 0.59mm/px · 1 of 116 slices shown]
[im 58/116  mediastinal]
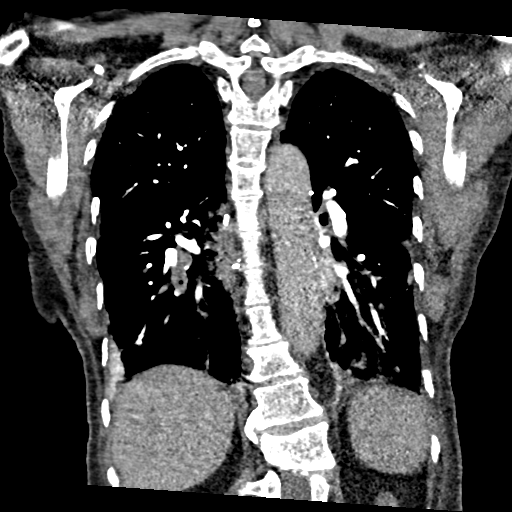

[19 of 36 positions shown; findings below may reference images not displayed]

FINDINGS: Cardiovascular: There is mild cardiomegaly. No pericardial effusion.
Mild atherosclerotic calcification of the thoracic aorta. No
pulmonary artery embolus identified.

Mediastinum/Nodes: No definite hilar adenopathy. Multiple surgical
clips noted in the mediastinum. The esophagus is grossly
unremarkable. There is retained ingested matter versus mucous
secretion within the esophagus. No mediastinal fluid collection.

Lungs/Pleura: Bilateral lower lobe predominant pulmonary opacities,
left greater right most consistent with pneumonia or aspiration.
Mucus secretion or aspirated content noted in the right lower lobe
bronchi. No pleural effusion or pneumothorax.

Upper Abdomen: Small hepatic hypodense lesions are not
characterized.

Musculoskeletal: Osteopenia with degenerative changes of the spine.
Lower sternal fixation wire. Mild old appearing T12 compression
fracture. No acute osseous pathology.

Review of the MIP images confirms the above findings.
IMPRESSION: 1. No CT evidence of pulmonary artery embolus.
2. Bilateral lower lobe predominant pulmonary opacities, left
greater right most consistent with pneumonia or aspiration. Mucus
secretion or aspirated content noted in the right lower lobe
bronchi.
3. Aortic Atherosclerosis ([id number]).
